# Patient Record
Sex: Male | Born: 1940 | Race: White | Hispanic: No | Marital: Married | Smoking: Never smoker
Health system: Southern US, Community
[De-identification: ages and names within clinical notes are randomized; demographics above are authoritative.]

## PROBLEM LIST (undated history)

## (undated) DIAGNOSIS — M1990 Unspecified osteoarthritis, unspecified site: Secondary | ICD-10-CM

## (undated) DIAGNOSIS — K219 Gastro-esophageal reflux disease without esophagitis: Secondary | ICD-10-CM

## (undated) DIAGNOSIS — I10 Essential (primary) hypertension: Secondary | ICD-10-CM

## (undated) DIAGNOSIS — N200 Calculus of kidney: Secondary | ICD-10-CM

## (undated) DIAGNOSIS — T7840XA Allergy, unspecified, initial encounter: Secondary | ICD-10-CM

## (undated) DIAGNOSIS — R1319 Other dysphagia: Secondary | ICD-10-CM

## (undated) HISTORY — DX: Gastro-esophageal reflux disease without esophagitis: K21.9

## (undated) HISTORY — DX: Unspecified osteoarthritis, unspecified site: M19.90

## (undated) HISTORY — DX: Calculus of kidney: N20.0

## (undated) HISTORY — DX: Essential (primary) hypertension: I10

## (undated) HISTORY — DX: Allergy, unspecified, initial encounter: T78.40XA

## (undated) HISTORY — PX: EYE SURGERY: SHX253

## (undated) HISTORY — PX: TONSILLECTOMY: SUR1361

## (undated) HISTORY — PX: HERNIA REPAIR: SHX51

## (undated) HISTORY — DX: Other dysphagia: R13.19

---

## 2005-10-30 ENCOUNTER — Emergency Department (HOSPITAL_COMMUNITY): Admission: EM | Admit: 2005-10-30 | Discharge: 2005-10-30 | Payer: Self-pay | Admitting: Emergency Medicine

## 2006-08-27 ENCOUNTER — Ambulatory Visit: Payer: Self-pay | Admitting: Internal Medicine

## 2006-08-27 LAB — CONVERTED CEMR LAB
BUN: 14 mg/dL (ref 6–23)
Basophils Absolute: 0 10*3/uL (ref 0.0–0.1)
Bilirubin Urine: NEGATIVE
CO2: 28 meq/L (ref 19–32)
Calcium: 8.9 mg/dL (ref 8.4–10.5)
Chloride: 107 meq/L (ref 96–112)
Chol/HDL Ratio, serum: 4
Cholesterol: 146 mg/dL (ref 0–200)
Creatinine, Ser: 1.1 mg/dL (ref 0.4–1.5)
HDL: 36.4 mg/dL — ABNORMAL LOW (ref >39.0)
Hemoglobin, Urine: NEGATIVE
Hemoglobin: 12.2 g/dL — ABNORMAL LOW (ref 13.0–17.0)
Ketones, ur: NEGATIVE mg/dL
LDL Cholesterol: 97 mg/dL (ref 0–99)
Lymphocytes Relative: 28.7 % (ref 12.0–46.0)
Monocytes Relative: 8.1 % (ref 3.0–11.0)
Neutrophils Relative %: 59.8 % (ref 43.0–77.0)
PSA: 1 ng/mL
Potassium: 4.6 meq/L (ref 3.5–5.1)
TSH: 1.66 microintl units/mL (ref 0.35–5.50)
Total Protein: 6.2 g/dL (ref 6.0–8.3)
Urine Glucose: NEGATIVE mg/dL

## 2006-09-06 ENCOUNTER — Ambulatory Visit: Payer: Self-pay | Admitting: Internal Medicine

## 2007-08-12 ENCOUNTER — Encounter: Payer: Self-pay | Admitting: Internal Medicine

## 2007-08-12 DIAGNOSIS — K219 Gastro-esophageal reflux disease without esophagitis: Secondary | ICD-10-CM

## 2007-08-12 DIAGNOSIS — K469 Unspecified abdominal hernia without obstruction or gangrene: Secondary | ICD-10-CM | POA: Insufficient documentation

## 2007-08-12 DIAGNOSIS — Z87442 Personal history of urinary calculi: Secondary | ICD-10-CM

## 2007-12-07 ENCOUNTER — Ambulatory Visit: Payer: Self-pay | Admitting: Internal Medicine

## 2007-12-07 DIAGNOSIS — M25519 Pain in unspecified shoulder: Secondary | ICD-10-CM | POA: Insufficient documentation

## 2008-02-20 ENCOUNTER — Ambulatory Visit: Payer: Self-pay | Admitting: Internal Medicine

## 2008-10-16 ENCOUNTER — Encounter: Payer: Self-pay | Admitting: Internal Medicine

## 2009-01-15 ENCOUNTER — Telehealth: Payer: Self-pay | Admitting: Internal Medicine

## 2009-07-09 ENCOUNTER — Ambulatory Visit: Payer: Self-pay | Admitting: Internal Medicine

## 2010-04-09 ENCOUNTER — Telehealth: Payer: Self-pay | Admitting: Internal Medicine

## 2010-12-02 NOTE — Progress Notes (Signed)
  Phone Note Call from Patient Call back at Work Phone [phone]   Summary of Call: Patient is requesting refill of levitra. Did not give call back number or pharmacy. left mess to call office back. Initial call taken by: Lamar Sprinkles, CMA,  April 09, 2010 3:22 PM  Follow-up for Phone Call        Patient is requesting refill at [facility] in [place], [phone] Follow-up by: Lamar Sprinkles, CMA,  April 09, 2010 3:44 PM  Additional Follow-up for Phone Call Additional follow up Details #1::        Pt aware that rx sent to pharmacy per scheduler.Marland KitchenMarland KitchenMarland KitchenAlvy Beal Archie CMA  April 10, 2010 10:40 AM     Prescriptions: LEVITRA 10 MG TABS (VARDENAFIL HCL) use as directed  #18 x 2   Entered by:   Lamar Sprinkles, CMA   Authorized by:   Jacques Navy MD   Signed by:   Lamar Sprinkles, CMA on 04/09/2010   Method used:   Electronically to        [facility] (retail)       [address]       [place], [place]  [address]       Ph: [phone]       Fax: [phone]   RxID:   [id number]

## 2011-03-20 NOTE — Assessment & Plan Note (Signed)
[facility]                             PRIMARY CARE OFFICE NOTE   TRIGG, DELAROCHA                        MRN:          [id number]  DATE:09/06/2006                            DOB:          11-Mar-1941    Antonio Ferrell is a very pleasant 70 year old Caucasian gentleman who presents  to establish for ongoing continuity care.   CHIEF COMPLAINT:  1. Right shoulder pain.  The patient has been told he has osteoarthritis      with wear and tear of the joint by x-ray.  He reports he has had a      steroid injection in April of 2007 with some improvement in discomfort.      He does have fairly normal range of motion except for external rotation      and extension.  He reports he has decreased the amount of weight he      uses with bench press and other exercises to avoid strain.  2. Sleep habit.  The patient reports he has occasional insomnia which is      intermittent.  3. Nocturia with two to three episodes nightly, however, this does not      disturb his sleep pattern and has been longstanding.   PAST MEDICAL HISTORY:  Surgical; right groin hernia repair at age 69 months.  Tonsillectomy in 1950.  Medical illnesses; chicken pox and mumps as a child.  History of GERD which is silent with history of dysphagia status post  esophageal dilatation.  History of nephrolithiasis with an episode in 1996  with spontaneous passage of stone, episode in 2007 with spontaneous passage  of stone.   CURRENT MEDICATIONS:  1. Aspirin 81 mg daily.  2. Prilosec 20 mg q.a.m.   HABITS:  Tobacco; none.  Alcohol; social drinker.   FAMILY HISTORY:  Positive for alcohol abuse in father.  Parents with  arthritis.  Father had first MI in his 76's, died of an MI at age 56.  Mother died at age 82, she had lung cancer possibly from passive smoke  exposure.  Family history is negative for colon cancer, prostate cancer, or  diabetes.   SOCIAL HISTORY:  The patient has a Master's  degree in education.  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  The  patient was married for 18 years and then was single for 18 years and was  married for 4-1/2 years.  He has no children.  His wife has generally been  in good health, but currently is being evaluated for musculoskeletal  disorder, possibly related to Lyme's disease.   REVIEW OF SYSTEMS:  The patient has had no fever, sweats, or chills.  He has  had a mild amount of insomnia as noted.  The patient has had an eye  examination in the last  12 months.  The patient has had extensive dental  work in the last 2 years with root canals and crowns.  No cardiovascular  complaints.  The patient did have an episode of bronchitis x2 and had been  seen by a pulmonologist for this, but has no ongoing chronic lung disease.  GASTROINTESTINAL:  The patient with GERD as noted.  GENITOURINARY:  No  complaints except for nocturia as noted.  MUSCULOSKELETAL:  As noted above  with right shoulder pain, but otherwise healthy.  DERMATOLOGIC:  The patient  sees a dermatologist on a regular basis for mild sun exposure related  lesions.  No neurologic or psychiatric problems.   HEALTH MAINTENANCE:  The patient exercises on a regular basis including both  aerobics and fast walking as well as weight work.  The patient reports he  had a colonoscopy in 2002 which was normal and was scheduled for follow-up  in 2012.  The patient reports he had Pneumovax in 2005.   PHYSICAL EXAMINATION:  VITAL SIGNS:  Temperature 97.5, blood pressure  147/91, pulse 66, weight 184.  GENERAL APPEARANCE:  This is a well-nourished, muscular, and athletic  gentleman looking younger than his stated chronologic age in no  acute  distress.  HEENT:  Normocephalic and atraumatic.  EAC's and TM's were unremarkable.  Oropharynx with native dentition in great repair.  No buccal or palatal  lesions were noted.  Posterior pharynx was clear.  Conjunctivae and sclerae  were clear.  Pupils equal, round, and reactive to light and accommodation.  Funduscopic examination with handheld instrument revealed normal disc  margins with no vascular abnormalities.  NECK:  Supple without thyromegaly.  NODES:  No adenopathy was noted in the cervical or supraclavicular regions.  CHEST:  No CVA tenderness.  LUNGS:  Clear to auscultation and percussion.  HEART:  2+ radial pulse, no JVD, no carotid bruits.  He had a quiet  precordium with regular rate and rhythm without murmurs, rubs, or gallops.  ABDOMEN:  Soft, no guarding or rebound.  No organosplenomegaly was noted.  GENITOURINARY:  Normal male phallus.  Bilaterally distended testicles  without masses.  RECTAL:  Normal sphincter tone.  The prostate was smooth, normal in size and  contour without nodules.  EXTREMITIES:  Without cyanosis, clubbing, or edema or deformity.  NEUROLOGY:  Nonfocal.   LABORATORY DATA:  Hemoglobin was 12.2 grams, white count was 5500 with a  normal differential.  Chemistries were normal with a blood sugar of 99.  Kidney function normal with a creatinine of 1.1 and a GFR of 72 mL per  minute.  Electrolytes were normal.  Liver functions were normal.  Cholesterol was 146, triglycerides 64, HDL 36.4, LDL was 97.  Thyroid  function normal with a TSH of 1.66, PSA was normal at 1.00.  Urinalysis was  negative.   ASSESSMENT:  1. Musculoskeletal.  The patient did have mild crepitus in the right      shoulder, but this was mild.  I am concerned that he may have a torn      rotator cuff limiting his range of motion with extension and rotation.      Plan; observation at this time.  If the patient has continued trouble,     we could consider repeat injection.   If the patient has trouble more      frequently than every 4 months for injection, I would recommend repeat      orthopedic evaluation.  2. Nocturia.  The patient with  a normal prostate.  He is having nocturia      x2 to 3, but at this point his symptoms are not sufficient for him to      desire to start medication.  He may want to curtail some of his evening      beverage.  3. Sleep habit.  The patient actually has only occasional intermittent      insomnia which does not require medical intervention at this time.  4. Health maintenance.  The patient does need dermatology on a routine      basis.  The patient is currently up to date with colorectal cancer      screening.  The patient had a normal prostate examination and normal      PSA at today's visit.   SUMMARY:  This is a very pleasant gentleman who seems to be medically very  stable and in good health.  I have asked him to return to see me in 1 year  or on a p.r.n. basis.   The patient had intended to have Zostavax at today's visit which I forgot.  We have contacted the patient and I recommended he check with his local  county health department for availability of Zostavax.  If he does not have  local availability in his home locale, we would be happy to see him back for  Zostavax if needed.    ______________________________  Antonio Gess Norins, MD    MEN/MedQ  DD: 09/07/2006  DT: 09/07/2006  Job #: [id number]   cc:   Lyndal Rainbow

## 2011-05-21 ENCOUNTER — Other Ambulatory Visit: Payer: Self-pay | Admitting: Internal Medicine

## 2011-08-10 ENCOUNTER — Encounter: Payer: Self-pay | Admitting: Internal Medicine

## 2011-09-01 ENCOUNTER — Encounter: Payer: Self-pay | Admitting: Internal Medicine

## 2011-09-01 ENCOUNTER — Ambulatory Visit (INDEPENDENT_AMBULATORY_CARE_PROVIDER_SITE_OTHER): Payer: Medicare Other | Admitting: Internal Medicine

## 2011-09-01 VITALS — BP 168/100 | HR 67 | Temp 97.0°F | Wt 178.0 lb

## 2011-09-01 DIAGNOSIS — Z23 Encounter for immunization: Secondary | ICD-10-CM

## 2011-09-01 DIAGNOSIS — M25519 Pain in unspecified shoulder: Secondary | ICD-10-CM

## 2011-09-01 DIAGNOSIS — I10 Essential (primary) hypertension: Secondary | ICD-10-CM

## 2011-09-01 MED ORDER — FUROSEMIDE 20 MG PO TABS: 20.0000 mg | ORAL_TABLET | Freq: Every day | ORAL | Status: DC

## 2011-09-01 NOTE — Patient Instructions (Signed)
Blood pressure - definitely in the treatment zone. Will start with a diuretic, furosemide 20 mg once a day, as a single agent. It will take 10-14 days to see the maximum benefit. Please check BP at home and report back. If the BP is not at goal, SBP 130 or less, DBP less than 90, will add another product, Losartan 50 mg an ARB which we can do without a visit. After a month of therapy you will need to have routine follow-up labv to check kidney function.  Arthritis treatment: it is ok to use any NSAID on an as needed basis. Meloxicam does have a lower risk profile in regard to bleeding and kidney insult.  Long term use of Prilosec, a PPI, and risks of bone weakening and anemia are to my knowledge not well substantiated. In the setting of painless reflux with a history of esophageal stricture you need some type of protection. You may ask Dr. Juanda Chance about the use of H2  Blockers, i.e. Zantac etc instead of PPIs.

## 2011-09-02 DIAGNOSIS — I10 Essential (primary) hypertension: Secondary | ICD-10-CM | POA: Insufficient documentation

## 2011-09-02 NOTE — Progress Notes (Signed)
  Subjective:    Patient ID: Antonio Ferrell, male    DOB: 1941/06/26, 70 y.o.   MRN: [id number]  HPI Antonio Ferrell is here today to discuss blood pressure control. Over the past year or so there has been a gradual increase in BP. He is very fit, exercising with weight training and aerobics several times a week. He has had no symptoms. He is ready to start treatment.  Antonio Ferrell has a history of joint issues: some knee pain, fairly significant right shoulder pain with some limitation in range of motion and increasing irritation of the left shoulder. He sees an orthopedist in [place] who has prescribed meloxicam 15 mg qd. He has tried this medication with relief of his symptoms but he has a concern about daily use. Reviewed the mechanism of action of NSAIDs, meloxciam as a COX II selective agent in particular  He is otherwise doing well and feels healthy.  Past Medical History  Diagnosis Date  . Other dysphagia   . Nephrolithiasis   . GERD (gastroesophageal reflux disease)    Past Surgical History  Procedure Date  . Tonsillectomy   . Hernia repair    Family History  Problem Relation Age of Onset  . Cancer Mother   . Heart disease Father   . Heart attack Father   . Alcohol abuse Father    History   Social History  . Marital Status: Married    Spouse Name: N/A    Number of Children: N/A  . Years of Education: N/A   Occupational History  . Not on file.   Social History Main Topics  . Smoking status: Not on file  . Smokeless tobacco: Not on file  . Alcohol Use:   . Drug Use:   . Sexually Active:    Other Topics Concern  . 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 Refurbished and restored a hypercub       Review of  Systems System review is negative for any constitutional, cardiac, pulmonary, GI or neuro symptoms or complaints other than as described in the HPI.     Objective:   Physical Exam Vitals noted - elevated BP Gen'l - very fit appearing white man who looks younger than his stated age HEENT C&S clear Pulm - normal respirations Cor - RRR Neuro - A&O x 3, CN II-XII normal, normal gait.       Assessment & Plan:

## 2011-09-02 NOTE — Assessment & Plan Note (Signed)
BP Readings from Last 3 Encounters:  09/01/11 168/100  07/09/09 144/90  12/07/07 160/92   Discussed treatment options.  Plan - furosemide 20 mg qd - he is to report back readings after 10-14 days           If not at goal of 130's/80's will add losartan 50 mg qd           He will need lab in 4-6 weeks: follow renal function and potassium

## 2011-09-02 NOTE — Assessment & Plan Note (Signed)
Reviewed mechanism of NSAIDs as well as various products and dosing comparisons  Plan - he will use ibuprofen 200 mg, 1-3 tabs, prn for discomfort.

## 2011-09-18 ENCOUNTER — Encounter: Payer: Self-pay | Admitting: Internal Medicine

## 2011-09-18 ENCOUNTER — Ambulatory Visit (INDEPENDENT_AMBULATORY_CARE_PROVIDER_SITE_OTHER): Payer: Medicare Other | Admitting: Internal Medicine

## 2011-09-18 VITALS — BP 120/82 | HR 62 | Ht 68.0 in | Wt 179.4 lb

## 2011-09-18 DIAGNOSIS — K219 Gastro-esophageal reflux disease without esophagitis: Secondary | ICD-10-CM

## 2011-09-18 DIAGNOSIS — K222 Esophageal obstruction: Secondary | ICD-10-CM

## 2011-09-18 DIAGNOSIS — Z1211 Encounter for screening for malignant neoplasm of colon: Secondary | ICD-10-CM

## 2011-09-18 NOTE — Progress Notes (Signed)
Antonio Ferrell [birth year] MRN [id number]    History of Present Illness:  This is a 70 year old white male who is here to discuss having a recall colonoscopy and possible upper endoscopy. He had a colonoscopy in 2003 in [place]. It was a normal exam. There is no family history of colon cancer or any symptoms of bleeding, abdominal pain or change in bowel habits. He would be due for a repeat colonoscopy in March 2013. Another issue has been gastroesophageal reflux. Approximately 5 years ago, he underwent an upper endoscopy and esophageal dilation in [place] for a benign distal esophageal stricture. The dilatation was effective and he has not complained of dysphagia since then. He has remained on Prilosec 20 mg daily. He does not follow anti-reflex measures including head of the bed elevation because of his back problems. He does not smoke, does not drink alcohol or excess caffeine. His weight has been stable. He denies any dysphagia or odynophagia. He would like to know if cutting Prilosec down to 10 mg a day would be acceptable. He is also inquiring about Nissen fundoplication. He has never had actual heartburn.   Past Medical History  Diagnosis Date  . Other dysphagia   . Nephrolithiasis   . GERD (gastroesophageal reflux disease)   . High blood pressure    Past Surgical History  Procedure Date  . Tonsillectomy   . Hernia repair     right groin    reports that he has never smoked. He has never used smokeless tobacco. He reports that he drinks alcohol. He reports that he does not use illicit drugs. family history includes Alcohol abuse in his father; Heart attack in his father; Heart disease in his father; and Lung cancer in his mother. No Known Allergies      Review of Systems:Denies abdominal pain shortness of breath chest pain  The remainder of the 10 point ROS is negative except as outlined in H&P     Assessment and Plan:  Problem #1 chronic gastroesophageal reflux. Patient  has had silent reflux. That means he has never had heartburn but actually has an esophageal stricture. In order to establish the appropriate amount of Prilosec, we would have to do either a 24 hour pH probe or a bravo probe and he is not interested in doing it. He will try reducing his Prilosec to 10 mg a day realizing that he may need esophageal dilatation in the near future if he has inadequate acid suppression. I also strongly urged him to follow antireflux measures to minimize nocturnal reflux. He would like to have an upper endoscopy with possible dilatation at the time of his colonoscopy.  Problem #2 colorectal screening. He will receive a recall letter in February 2013 for a March 2013 procedure. We will at that time also complete an upper endoscopy and dilatation.     09/18/2011 Antonio Ferrell

## 2011-09-18 NOTE — Patient Instructions (Addendum)
You will be due for a recall endoscopy/colonoscopy in 01/2012. We will send you a reminder in the mail when it gets closer to that time. CC: Dr Debby Bud

## 2011-10-19 ENCOUNTER — Ambulatory Visit (INDEPENDENT_AMBULATORY_CARE_PROVIDER_SITE_OTHER): Payer: Medicare Other | Admitting: Internal Medicine

## 2011-10-19 DIAGNOSIS — I10 Essential (primary) hypertension: Secondary | ICD-10-CM

## 2011-10-19 DIAGNOSIS — Z23 Encounter for immunization: Secondary | ICD-10-CM

## 2011-10-19 MED ORDER — LISINOPRIL 10 MG PO TABS: 10.0000 mg | ORAL_TABLET | Freq: Every day | ORAL | Status: DC

## 2011-10-19 MED ORDER — TETANUS-DIPHTH-ACELL PERTUSSIS 5-2.5-18.5 LF-MCG/0.5 IM SUSP: 0.5000 mL | Freq: Once | INTRAMUSCULAR | Status: DC

## 2011-10-19 NOTE — Progress Notes (Signed)
  Subjective:    Patient ID: Quatavious Rossa, male    DOB: 1941-06-24, 70 y.o.   MRN: [id number]  HPI Mr. Lantigua presents for follow-up of BP. He is taking furosemide as a single agent. He reports that his BP at home is in the 130-140 range over 80-90. He is tolerating medication well.   I have reviewed the patient's medical history in detail and updated the computerized patient record.    Review of Systems System review is negative for any constitutional, cardiac, pulmonary, GI or neuro symptoms or complaints other than as described in the HPI.     Objective:   Physical Exam Vitals, including Blood pressure reviewed Gen'l- WNWD white man in no distress HEENT C&S clear Cor- RRR Pulm - normal respirations Neuro - A&O x 3       Assessment & Plan:

## 2011-10-19 NOTE — Patient Instructions (Signed)
Start lisinopril for better blood pressure control. Come by the lab in 2+ weeks for kidney and potassium check - no appointment needed. Call to report your blood pressure readings. After you labs I will send you a letter with the results and the history of your BP control.

## 2011-10-20 NOTE — Assessment & Plan Note (Signed)
BP Readings from Last 3 Encounters:  10/19/11 148/100  09/18/11 120/82  09/01/11 168/100   suboptimal control on office reading. Home readings are borderline to high.  Plan - add lisinopril to regimen           Continue furosemide           Follow-up metabolic panel in 2-3 weeks.

## 2011-10-30 ENCOUNTER — Emergency Department (HOSPITAL_COMMUNITY): Payer: Medicare Other

## 2011-10-30 ENCOUNTER — Encounter (HOSPITAL_COMMUNITY): Payer: Self-pay | Admitting: Emergency Medicine

## 2011-10-30 ENCOUNTER — Emergency Department (HOSPITAL_COMMUNITY)
Admission: EM | Admit: 2011-10-30 | Discharge: 2011-10-30 | Disposition: A | Discharge status: Home / Self Care | Payer: Medicare Other | Attending: Emergency Medicine | Admitting: Emergency Medicine

## 2011-10-30 DIAGNOSIS — Z79899 Other long term (current) drug therapy: Secondary | ICD-10-CM | POA: Insufficient documentation

## 2011-10-30 DIAGNOSIS — K219 Gastro-esophageal reflux disease without esophagitis: Secondary | ICD-10-CM | POA: Insufficient documentation

## 2011-10-30 DIAGNOSIS — J3489 Other specified disorders of nose and nasal sinuses: Secondary | ICD-10-CM | POA: Insufficient documentation

## 2011-10-30 DIAGNOSIS — R509 Fever, unspecified: Secondary | ICD-10-CM | POA: Insufficient documentation

## 2011-10-30 DIAGNOSIS — J029 Acute pharyngitis, unspecified: Secondary | ICD-10-CM

## 2011-10-30 DIAGNOSIS — Z7982 Long term (current) use of aspirin: Secondary | ICD-10-CM | POA: Insufficient documentation

## 2011-10-30 DIAGNOSIS — R6889 Other general symptoms and signs: Secondary | ICD-10-CM | POA: Insufficient documentation

## 2011-10-30 DIAGNOSIS — R059 Cough, unspecified: Secondary | ICD-10-CM | POA: Insufficient documentation

## 2011-10-30 DIAGNOSIS — R05 Cough: Secondary | ICD-10-CM | POA: Insufficient documentation

## 2011-10-30 DIAGNOSIS — R634 Abnormal weight loss: Secondary | ICD-10-CM | POA: Insufficient documentation

## 2011-10-30 DIAGNOSIS — R1310 Dysphagia, unspecified: Secondary | ICD-10-CM | POA: Insufficient documentation

## 2011-10-30 DIAGNOSIS — I10 Essential (primary) hypertension: Secondary | ICD-10-CM | POA: Insufficient documentation

## 2011-10-30 LAB — DIFFERENTIAL
Basophils Absolute: 0 10*3/uL (ref 0.0–0.1)
Basophils Relative: 0 % (ref 0–1)
Eosinophils Absolute: 0.4 10*3/uL (ref 0.0–0.7)
Eosinophils Relative: 3 % (ref 0–5)

## 2011-10-30 LAB — CBC
MCH: 27.9 pg (ref 26.0–34.0)
MCV: 81.5 fL (ref 78.0–100.0)
Platelets: 252 10*3/uL (ref 150–400)
RDW: 13.2 % (ref 11.5–15.5)

## 2011-10-30 LAB — POCT I-STAT, CHEM 8
Hemoglobin: 16 g/dL (ref 13.0–17.0)
Sodium: 143 mEq/L (ref 135–145)
TCO2: 28 mmol/L (ref 0–100)

## 2011-10-30 LAB — POCT I-STAT TROPONIN I: Troponin i, poc: 0 ng/mL (ref 0.00–0.08)

## 2011-10-30 MED ORDER — IOHEXOL 300 MG/ML  SOLN
100.0000 mL | Freq: Once | INTRAMUSCULAR | Status: AC | PRN
  Administered 2011-10-30: 100 mL via INTRAVENOUS

## 2011-10-30 MED ORDER — AMOXICILLIN-POT CLAVULANATE 875-125 MG PO TABS: 1.0000 | ORAL_TABLET | Freq: Two times a day (BID) | ORAL | Status: AC

## 2011-10-30 MED ORDER — DEXAMETHASONE SODIUM PHOSPHATE 4 MG/ML IJ SOLN
4.0000 mg | Freq: Once | INTRAMUSCULAR | Status: AC
  Administered 2011-10-30: 4 mg via INTRAMUSCULAR
  Filled 2011-10-30: qty 1

## 2011-10-30 NOTE — ED Notes (Signed)
Patient stable upon discharge.  

## 2011-10-30 NOTE — ED Provider Notes (Addendum)
History     CSN: [id number]  Arrival date & time 10/30/11  0005   First MD Initiated Contact with Patient 10/30/11 0410      Chief Complaint  Patient presents with  . URI    (Consider location/radiation/quality/duration/timing/severity/associated sxs/prior treatment) Patient is a 70 y.o. male presenting with URI and cough. The history is provided by the patient and a caregiver. No language interpreter was used.  URI The primary symptoms include fever and cough. Primary symptoms do not include headaches, wheezing or myalgias. Primary symptoms comment: runny nose nasal congestion The current episode started 3 to 5 days ago. This is a new problem. The problem has been gradually worsening.  Associated with: unknown. Symptoms associated with the illness include congestion and rhinorrhea. The illness is not associated with chills, facial pain or sinus pressure. Risk factors for severe complications from URI include being elderly.  Cough This is a new problem. The current episode started more than 2 days ago. The problem occurs constantly. The problem has not changed since onset.The cough is non-productive. There has been no fever. Associated symptoms include weight loss and rhinorrhea. Pertinent negatives include no chest pain, no chills, no sweats, no headaches, no myalgias, no shortness of breath, no wheezing and no eye redness. He has tried nothing for the symptoms. The treatment provided no relief. He is not a smoker. His past medical history does not include pneumonia or emphysema.  Awoke and felt like there was something deep in the throat causing him to choke.  No f/c/r.  No change in voice.    Past Medical History  Diagnosis Date  . Other dysphagia   . Nephrolithiasis   . GERD (gastroesophageal reflux disease)   . High blood pressure     Past Surgical History  Procedure Date  . Tonsillectomy   . Hernia repair     right groin    Family History  Problem Relation Age of Onset    . Lung cancer Mother   . Heart disease Father   . Heart attack Father   . Alcohol abuse Father     History  Substance Use Topics  . Smoking status: Never Smoker   . Smokeless tobacco: Never Used  . Alcohol Use: Yes      Review of Systems  Constitutional: Positive for fever and weight loss. Negative for chills.  HENT: Positive for congestion and rhinorrhea. Negative for nosebleeds, neck stiffness, sinus pressure and ear discharge.   Eyes: Negative for discharge and redness.  Respiratory: Positive for cough. Negative for chest tightness, shortness of breath, wheezing and stridor.   Cardiovascular: Negative for chest pain.  Genitourinary: Positive for decreased urine volume. Negative for difficulty urinating.  Musculoskeletal: Negative for myalgias.  Neurological: Negative for headaches.  Hematological: Negative.   Psychiatric/Behavioral: Negative.     Allergies  Review of patient's allergies indicates no known allergies.  Home Medications   Current Outpatient Rx  Name Route Sig Dispense Refill  . ASPIRIN 81 MG PO TABS Oral Take 81 mg by mouth daily.      Marland Kitchen DIPHENHYDRAMINE HCL 25 MG PO TABS Oral Take 25 mg by mouth every 6 (six) hours as needed. Allergies      . OMEGA-3 FATTY ACIDS 1000 MG PO CAPS Oral Take 2 g by mouth daily.      . FUROSEMIDE 20 MG PO TABS Oral Take 1 tablet (20 mg total) by mouth daily. 30 tablet 11  . LISINOPRIL 10 MG PO TABS Oral Take  1 tablet (10 mg total) by mouth daily. 30 tablet 11  . MELATONIN 3 MG PO CAPS Oral Take 1 capsule by mouth at bedtime.      . MELOXICAM 15 MG PO TABS Oral Take 15 mg by mouth daily.      . ADULT MULTIVITAMIN W/MINERALS CH Oral Take 1 tablet by mouth daily.      Marland Kitchen OMEPRAZOLE 20 MG PO CPDR Oral Take 20 mg by mouth daily.     Marland Kitchen PSEUDOEPH-DOXYLAMINE-DM-APAP 60-7.03-31-999 MG/30ML PO LIQD Oral Take 30 mLs by mouth every evening. Cold symptoms      . LEVITRA 10 MG PO TABS  TAKE AS DIRECTED 18 tablet 2    BP 163/98  Pulse  62  Temp(Src) 98.7 F (37.1 C) (Oral)  Resp 18  SpO2 100%  Physical Exam  Constitutional: He is oriented to person, place, and time. He appears well-developed and well-nourished. No distress.  HENT:  Head: Normocephalic and atraumatic.  Mouth/Throat: Oropharynx is clear and moist. No oropharyngeal exudate.  Eyes: EOM are normal. Pupils are equal, round, and reactive to light.  Neck: Normal range of motion. Neck supple. No JVD present.  Cardiovascular: Normal rate and regular rhythm.   Pulmonary/Chest: Effort normal and breath sounds normal. No stridor. He has no wheezes.  Abdominal: Soft. Bowel sounds are normal. There is no tenderness. There is no rebound and no guarding.  Musculoskeletal: Normal range of motion.  Lymphadenopathy:    He has no cervical adenopathy.  Neurological: He is alert and oriented to person, place, and time.  Skin: Skin is warm and dry.  Psychiatric: Thought content normal.    ED Course  Procedures (including critical care time)  Labs Reviewed  CBC - Abnormal; Notable for the following:    WBC 13.5 (*)    All other components within normal limits  DIFFERENTIAL - Abnormal; Notable for the following:    Neutro Abs 9.2 (*)    Monocytes Absolute 1.2 (*)    All other components within normal limits  POCT I-STAT, CHEM 8 - Abnormal; Notable for the following:    BUN 26 (*)    All other components within normal limits  POCT I-STAT TROPONIN I  I-STAT TROPONIN I  I-STAT, CHEM 8   Dg Neck Soft Tissue  10/30/2011  *RADIOLOGY REPORT*  Clinical Data: Difficulty breathing and swallowing; concern for epiglottitis.  NECK SOFT TISSUES - 1+ VIEW  Comparison: None.  Findings: There is mild apparent thickening of the epiglottis. This is difficult to fully assess due to patient rotation.  The aryepiglottic folds may be mildly prominent.  The nasopharynx, oropharynx and hypopharynx are otherwise unremarkable. Prevertebral soft tissues are grossly normal in thickness.  The  proximal trachea is within normal limits.  No acute osseous abnormalities are seen.  The mastoid air cells and paranasal sinuses are well-aerated.  IMPRESSION: Mild apparent thickening of the epiglottis; given clinical concern, mild epiglottitis cannot be excluded.  Findings were discussed with Dr. Symphanie Cederberg-Rasch at 05:37 a.m. on 10/30/2011.  Original Report Authenticated By: Tonia Ghent, M.D.   Dg Chest 2 View  10/30/2011  *RADIOLOGY REPORT*  Clinical Data: Cough and shortness of breath for 3 days.  CHEST - 2 VIEW  Comparison: None.  Findings: The lungs are well-aerated and clear.  There is no evidence of focal opacification, pleural effusion or pneumothorax.  The heart is normal in size; the mediastinal contour is within normal limits.  No acute osseous abnormalities are seen.  Mild degenerative  change is noted at the right humeral head.  IMPRESSION: No acute cardiopulmonary process seen.  Original Report Authenticated By: Tonia Ghent, M.D.     No diagnosis found.    MDM   Date: 10/30/2011  Rate: 60  Rhythm: normal sinus rhythm  QRS Axis: normal  Intervals: normal  ST/T Wave abnormalities: normal  Conduction Disutrbances:none  Narrative Interpretation:   Old EKG Reviewed: none available     Call EMS immediately for change in voice shortness of breath or choking sensation.  Take all antibiotics.  Patient and wife verbalize understanding and agree to follow up   TRUE Shackleford K Roger Kettles-Rasch, MD 10/30/11 0707  Jasmine Awe, MD 10/30/11 [phone]

## 2011-10-30 NOTE — ED Notes (Signed)
Pt states that he has had a cold for two days but woke up tonight and felt like he was choking and couldn't breathe. 100% on RA.

## 2011-11-05 DIAGNOSIS — J410 Simple chronic bronchitis: Secondary | ICD-10-CM | POA: Diagnosis not present

## 2011-11-05 DIAGNOSIS — K219 Gastro-esophageal reflux disease without esophagitis: Secondary | ICD-10-CM | POA: Diagnosis not present

## 2011-11-05 DIAGNOSIS — J449 Chronic obstructive pulmonary disease, unspecified: Secondary | ICD-10-CM | POA: Diagnosis not present

## 2011-11-05 DIAGNOSIS — T887XXA Unspecified adverse effect of drug or medicament, initial encounter: Secondary | ICD-10-CM | POA: Diagnosis not present

## 2011-12-10 DIAGNOSIS — R05 Cough: Secondary | ICD-10-CM | POA: Diagnosis not present

## 2011-12-10 DIAGNOSIS — J3089 Other allergic rhinitis: Secondary | ICD-10-CM | POA: Diagnosis not present

## 2011-12-10 DIAGNOSIS — J449 Chronic obstructive pulmonary disease, unspecified: Secondary | ICD-10-CM | POA: Diagnosis not present

## 2011-12-10 DIAGNOSIS — K219 Gastro-esophageal reflux disease without esophagitis: Secondary | ICD-10-CM | POA: Diagnosis not present

## 2011-12-10 DIAGNOSIS — T887XXA Unspecified adverse effect of drug or medicament, initial encounter: Secondary | ICD-10-CM | POA: Diagnosis not present

## 2011-12-14 ENCOUNTER — Other Ambulatory Visit (INDEPENDENT_AMBULATORY_CARE_PROVIDER_SITE_OTHER): Payer: Medicare Other

## 2011-12-14 DIAGNOSIS — I10 Essential (primary) hypertension: Secondary | ICD-10-CM | POA: Diagnosis not present

## 2011-12-14 LAB — COMPREHENSIVE METABOLIC PANEL
ALT: 21 U/L (ref 0–53)
CO2: 29 mEq/L (ref 19–32)
Creatinine, Ser: 1 mg/dL (ref 0.4–1.5)
GFR: 76.88 mL/min (ref >60.00)
Total Bilirubin: 0.6 mg/dL (ref 0.3–1.2)

## 2011-12-20 ENCOUNTER — Encounter: Payer: Self-pay | Admitting: Internal Medicine

## 2012-01-04 DIAGNOSIS — L570 Actinic keratosis: Secondary | ICD-10-CM | POA: Diagnosis not present

## 2012-01-04 DIAGNOSIS — C44320 Squamous cell carcinoma of skin of unspecified parts of face: Secondary | ICD-10-CM | POA: Diagnosis not present

## 2012-01-04 DIAGNOSIS — D485 Neoplasm of uncertain behavior of skin: Secondary | ICD-10-CM | POA: Diagnosis not present

## 2012-01-18 DIAGNOSIS — L570 Actinic keratosis: Secondary | ICD-10-CM | POA: Diagnosis not present

## 2012-01-18 DIAGNOSIS — C44320 Squamous cell carcinoma of skin of unspecified parts of face: Secondary | ICD-10-CM | POA: Diagnosis not present

## 2012-02-08 ENCOUNTER — Encounter: Payer: Self-pay | Admitting: Internal Medicine

## 2012-02-29 ENCOUNTER — Ambulatory Visit (INDEPENDENT_AMBULATORY_CARE_PROVIDER_SITE_OTHER): Payer: Medicare Other | Admitting: Internal Medicine

## 2012-02-29 ENCOUNTER — Encounter: Payer: Self-pay | Admitting: Internal Medicine

## 2012-02-29 VITALS — BP 138/88 | HR 69 | Temp 97.8°F | Resp 16 | Wt 182.0 lb

## 2012-02-29 DIAGNOSIS — I10 Essential (primary) hypertension: Secondary | ICD-10-CM | POA: Diagnosis not present

## 2012-02-29 NOTE — Assessment & Plan Note (Signed)
BP Readings from Last 3 Encounters:  02/29/12 138/88  10/30/11 122/64  10/19/11 148/100   Adequately controlled blood pressure on diuretics alone.  Plan - continue present medication           Continue to monitor BP at home and call if there is a pattern of elevated readings.

## 2012-02-29 NOTE — Progress Notes (Signed)
  Subjective:    Patient ID: Antonio Ferrell, male    DOB: 03/29/1941, 71 y.o.   MRN: [id number]  HPI Antonio Ferrell was started on lisinopril in addition to furosemide for control of mild hypertension. In December he had an URI with some mild laryngeal edema. Over the next several months he had a persistent cough but no further angioedema. He stopped lisinopril and the cough resolved. He reports that when he monitors his BP at home the 120-140/70's-80's. He has been asymptomatic.  PMH, FamHx and SocHx reviewed for any changes and relevance.    Review of Systems System review is negative for any constitutional, cardiac, pulmonary, GI or neuro symptoms or complaints other than as described in the HPI.     Objective:   Physical Exam Filed Vitals:   02/29/12 0953  BP: 138/88  Pulse: 69  Temp: 97.8 F (36.6 C)  Resp: 16   Gen'l- WNWD white male Chest- good breath sounds  Cor - RRR      Assessment & Plan:

## 2012-03-10 ENCOUNTER — Ambulatory Visit (AMBULATORY_SURGERY_CENTER): Payer: Medicare Other | Admitting: *Deleted

## 2012-03-10 VITALS — Ht 67.0 in | Wt 182.0 lb

## 2012-03-10 DIAGNOSIS — K222 Esophageal obstruction: Secondary | ICD-10-CM

## 2012-03-10 DIAGNOSIS — Z1211 Encounter for screening for malignant neoplasm of colon: Secondary | ICD-10-CM

## 2012-03-10 DIAGNOSIS — K219 Gastro-esophageal reflux disease without esophagitis: Secondary | ICD-10-CM

## 2012-03-10 MED ORDER — PEG-KCL-NACL-NASULF-NA ASC-C 100 G PO SOLR: ORAL | Status: DC

## 2012-03-24 ENCOUNTER — Encounter: Payer: Self-pay | Admitting: Internal Medicine

## 2012-03-24 ENCOUNTER — Emergency Department (HOSPITAL_COMMUNITY): Payer: Medicare Other

## 2012-03-24 ENCOUNTER — Telehealth: Payer: Self-pay | Admitting: *Deleted

## 2012-03-24 ENCOUNTER — Emergency Department (HOSPITAL_COMMUNITY)
Admission: EM | Admit: 2012-03-24 | Discharge: 2012-03-24 | Disposition: A | Discharge status: Home / Self Care | Payer: Medicare Other | Attending: Emergency Medicine | Admitting: Emergency Medicine

## 2012-03-24 ENCOUNTER — Telehealth: Payer: Self-pay | Admitting: Internal Medicine

## 2012-03-24 ENCOUNTER — Encounter (HOSPITAL_COMMUNITY): Payer: Self-pay

## 2012-03-24 ENCOUNTER — Ambulatory Visit (AMBULATORY_SURGERY_CENTER): Payer: Medicare Other | Admitting: Internal Medicine

## 2012-03-24 VITALS — BP 150/91 | HR 77 | Temp 95.7°F | Resp 18 | Ht 67.0 in | Wt 180.0 lb

## 2012-03-24 DIAGNOSIS — K219 Gastro-esophageal reflux disease without esophagitis: Secondary | ICD-10-CM | POA: Diagnosis not present

## 2012-03-24 DIAGNOSIS — D72829 Elevated white blood cell count, unspecified: Secondary | ICD-10-CM

## 2012-03-24 DIAGNOSIS — Z1211 Encounter for screening for malignant neoplasm of colon: Secondary | ICD-10-CM

## 2012-03-24 DIAGNOSIS — Z79899 Other long term (current) drug therapy: Secondary | ICD-10-CM | POA: Diagnosis not present

## 2012-03-24 DIAGNOSIS — R1319 Other dysphagia: Secondary | ICD-10-CM | POA: Diagnosis not present

## 2012-03-24 DIAGNOSIS — R509 Fever, unspecified: Secondary | ICD-10-CM | POA: Insufficient documentation

## 2012-03-24 DIAGNOSIS — K222 Esophageal obstruction: Secondary | ICD-10-CM

## 2012-03-24 DIAGNOSIS — Z7982 Long term (current) use of aspirin: Secondary | ICD-10-CM | POA: Diagnosis not present

## 2012-03-24 DIAGNOSIS — R1310 Dysphagia, unspecified: Secondary | ICD-10-CM | POA: Diagnosis not present

## 2012-03-24 DIAGNOSIS — R112 Nausea with vomiting, unspecified: Secondary | ICD-10-CM | POA: Diagnosis not present

## 2012-03-24 DIAGNOSIS — R1084 Generalized abdominal pain: Secondary | ICD-10-CM | POA: Diagnosis not present

## 2012-03-24 DIAGNOSIS — R1110 Vomiting, unspecified: Secondary | ICD-10-CM | POA: Diagnosis not present

## 2012-03-24 LAB — COMPREHENSIVE METABOLIC PANEL
ALT: 19 U/L (ref 0–53)
AST: 29 U/L (ref 0–37)
CO2: 22 mEq/L (ref 19–32)
Calcium: 8.8 mg/dL (ref 8.4–10.5)
Chloride: 102 mEq/L (ref 96–112)
GFR calc non Af Amer: 64 mL/min — ABNORMAL LOW (ref >90)
Sodium: 137 mEq/L (ref 135–145)
Total Bilirubin: 0.8 mg/dL (ref 0.3–1.2)

## 2012-03-24 LAB — DIFFERENTIAL
Lymphocytes Relative: 5 % — ABNORMAL LOW (ref 12–46)
Lymphs Abs: 0.9 10*3/uL (ref 0.7–4.0)
Neutrophils Relative %: 88 % — ABNORMAL HIGH (ref 43–77)

## 2012-03-24 LAB — CBC
Platelets: 266 10*3/uL (ref 150–400)
RBC: 5.09 MIL/uL (ref 4.22–5.81)
WBC: 20.1 10*3/uL — ABNORMAL HIGH (ref 4.0–10.5)

## 2012-03-24 LAB — LACTIC ACID, PLASMA: Lactic Acid, Venous: 1.4 mmol/L (ref 0.5–2.2)

## 2012-03-24 MED ORDER — SODIUM CHLORIDE 0.9 % IV SOLN: 500.0000 mL | INTRAVENOUS | Status: DC

## 2012-03-24 NOTE — Progress Notes (Signed)
Patient did not experience any of the following events: a burn prior to discharge; a fall within the facility; wrong site/side/patient/procedure/implant event; or a hospital transfer or hospital admission upon discharge from the facility. (G8907) Patient did not have preoperative order for IV antibiotic SSI prophylaxis. (G8918)  

## 2012-03-24 NOTE — ED Notes (Signed)
Pt states "had tdap about a year ago with my doctor"  tdap not given .  md lockwood notified.

## 2012-03-24 NOTE — Op Note (Signed)
[facility] [address] [place], [place]  [address]  COLONOSCOPY PROCEDURE REPORT  PATIENT:  Antonio Ferrell, Antonio Ferrell  MR#:  [id number] BIRTHDATE:  31-Dec-1940, 70 yrs. old  GENDER:  male ENDOSCOPIST:  Hedwig Morton. Juanda Chance, MD REF. BY:  Rosalyn Gess. Norins, M.D. PROCEDURE DATE:  03/24/2012 PROCEDURE:  Colonoscopy 32440 ASA CLASS:  Class II INDICATIONS:  colorectal cancer screening, average risk last colon in 2003 in [place], reportedly normal MEDICATIONS:   MAC sedation, administered by CRNA, propofol (Diprivan) 120 mg  DESCRIPTION OF PROCEDURE:   After the risks and benefits and of the procedure were explained, informed consent was obtained. Digital rectal exam was performed and revealed no rectal masses. The LB CF-H180AL [id number] endoscope was introduced through the anus and advanced to the cecum, which was identified by both the appendix and ileocecal valve.  The quality of the prep was good, using MoviPrep.  The instrument was then slowly withdrawn as the colon was fully examined. <<PROCEDUREIMAGES>>  FINDINGS:  Moderate diverticulosis was found in the sigmoid colon (see image1, image5, and image6).  This was otherwise a normal examination of the colon (see image7, image4, image3, and image2). Retroflexed views in the rectum revealed no abnormalities.    The scope was then withdrawn from the patient and the procedure completed.  COMPLICATIONS:  None ENDOSCOPIC IMPRESSION: 1) Moderate diverticulosis in the sigmoid colon 2) Otherwise normal examination RECOMMENDATIONS: 1) High fiber diet.  REPEAT EXAM:  In 10 year(s) for.  ______________________________ Hedwig Morton. Juanda Chance, MD  CC:  n. eSIGNED:   Hedwig Morton. Maelyn Berrey at 03/24/2012 11:14 AM  Lyndal Rainbow, [phone]

## 2012-03-24 NOTE — Discharge Instructions (Signed)
As discussed, it is very important that you speak with Dr. Oscar La tomorrow morning.  If you develop any new, or concerning changes in your condition, please return to the emergency department immediately.

## 2012-03-24 NOTE — Op Note (Signed)
[facility] [address] [place], [place]  [address]  ENDOSCOPY PROCEDURE REPORT  PATIENT:  Antonio Ferrell, Antonio Ferrell  MR#:  [id number] BIRTHDATE:  1941/03/18, 70 yrs. old  GENDER:  male  ENDOSCOPIST:  Hedwig Morton. Juanda Chance, MD Referred by:  Rosalyn Gess. Norins, M.D.  PROCEDURE DATE:  03/24/2012 PROCEDURE:  EGD, diagnostic 43235, Maloney Dilation of Esophagus ASA CLASS:  Class II INDICATIONS:  dysphagia hx of es. stricture, was dilated about 5 years ago  MEDICATIONS:   MAC sedation, administered by CRNA, propofol (Diprivan) 150 mg TOPICAL ANESTHETIC:  none  DESCRIPTION OF PROCEDURE:   After the risks benefits and alternatives of the procedure were thoroughly explained, informed consent was obtained.  The LB GIF-H180 [id number] endoscope was introduced through the mouth and advanced to the second portion of the duodenum, without limitations.  The instrument was slowly withdrawn as the mucosa was fully examined. <<PROCEDUREIMAGES>>  A stricture was found in the distal esophagus (see image1 and image8). mild non obstructing stricture, admitted the scope maloney dilator 23F dilator passed without difficulty  A hiatal hernia was found (see image7 and image5). 2 cm hh  Otherwise the examination was normal (see image6, image4, and image3). Retroflexed views revealed no abnormalities.    The scope was then withdrawn from the patient and the procedure completed.  COMPLICATIONS:  None  ENDOSCOPIC IMPRESSION: 1) Stricture in the distal esophagus 2) Hiatal hernia 3) Otherwise normal examination s/p passage of 48 F maloney dilator RECOMMENDATIONS: 1) Anti-reflux regimen to be follow Prilosec 10 mg prn  REPEAT EXAM:  In 0 year(s) for.  ______________________________ Hedwig Morton. Juanda Chance, MD  CC:  n. eSIGNED:   Hedwig Morton. Monee Dembeck at 03/24/2012 11:11 AM  Lyndal Rainbow, [phone]

## 2012-03-24 NOTE — Telephone Encounter (Signed)
Called pt. Back to remind him not to eat or drink anything since may do xrays in ER.

## 2012-03-24 NOTE — ED Notes (Signed)
Dc instructions provided.  Per md, pt encouraged to f/u with dr Arlyce Dice in morning.  No acute distress noted.  Pt wife at side.

## 2012-03-24 NOTE — ED Notes (Signed)
Patient had a colonoscopy today and about 2 hours later began having chills and had gradual temp elevation, the latest being 102.8. Patient vomited x 1.

## 2012-03-24 NOTE — Telephone Encounter (Signed)
Left message for patient to call me.  The message from the wife stated that the patient spiked a fever from appointment this am, and that he was vomiting.  Wife spoke with recovery room nurse, and pt is coming to [facility] via Elmon Kirschner, RN and Dr. Sharia Reeve.

## 2012-03-24 NOTE — Telephone Encounter (Signed)
Pt's wife called. Call was transferred into recovery room @ approx 4:15 pm . Pt's wife says pt . Has fever of 101.2 & pt is having chills. Has only drank a milk shake since procedure today and vomited that up. Pt. Denies any chest pain or shortness of breath, abdomen soft & pt says he is passing gas ok & has passed urine also. Spoke with Dr. Leone Payor & Cathlyn Parsons CRNA about pt's complaints. Called pt's wife back and told her pt needs to be brought to [facility] long emergency room as Dr. Leone Payor ordered. Dr. Gery Pray call) was paged & told pt . coming to [facility] ER by Elmon Kirschner RN.

## 2012-03-24 NOTE — ED Notes (Signed)
Pt had routine endoscopy and colonoscopy today. Began with fever and vomiting about 1400.

## 2012-03-24 NOTE — Patient Instructions (Signed)

## 2012-03-25 ENCOUNTER — Telehealth: Payer: Self-pay | Admitting: *Deleted

## 2012-03-25 DIAGNOSIS — R509 Fever, unspecified: Secondary | ICD-10-CM

## 2012-03-25 MED ORDER — CIPROFLOXACIN HCL 500 MG PO TABS: ORAL_TABLET | ORAL | Status: DC

## 2012-03-25 NOTE — Telephone Encounter (Signed)
Per Dr. Juanda Chance, patient needs Cipro 500 mg BID x 5 days and CBC on 03/29/12. Spoke with patient and gave him Dr. Regino Schultze recommendations. He will come for labs on Wednesday since he has another MD appointment that day and he lives in [place].

## 2012-03-25 NOTE — ED Provider Notes (Signed)
History     CSN: [id number]  Arrival date & time 03/24/12  1736   First MD Initiated Contact with Patient 03/24/12 1918      Chief Complaint  Patient presents with  . Fever  . Emesis    (Consider location/radiation/quality/duration/timing/severity/associated sxs/prior treatment) HPI The patient presents with fever, chills.  Notably, the patient had both colonoscopy and endoscopy today, approximately 7 hours prior to developing chills and fever.  Soon after he developed his chills he also became nauseous, had one episode of vomiting.  He notes that his symptoms began to resolve spontaneously and by the time of ED presentation are nearly absent.  He notes that these studies were being done as routine screening measures.  He states that he has been in his usual state of health without any ongoing illness.  No new medication changes. Past Medical History  Diagnosis Date  . Other dysphagia   . Nephrolithiasis   . GERD (gastroesophageal reflux disease)   . High blood pressure     Past Surgical History  Procedure Date  . Tonsillectomy   . Hernia repair     right groin    Family History  Problem Relation Age of Onset  . Lung cancer Mother   . Heart disease Father   . Heart attack Father   . Alcohol abuse Father   . Colon cancer Paternal Grandfather   . Esophageal cancer Neg Hx   . Stomach cancer Neg Hx   . Rectal cancer Neg Hx     History  Substance Use Topics  . Smoking status: Never Smoker   . Smokeless tobacco: Never Used  . Alcohol Use: No      Review of Systems  All other systems reviewed and are negative.    Allergies  Review of patient's allergies indicates no known allergies.  Home Medications   Current Outpatient Rx  Name Route Sig Dispense Refill  . AMOXICILLIN 500 MG PO CAPS Oral Take 500 mg by mouth 3 (three) times daily.    . ASPIRIN 81 MG PO TABS Oral Take 81 mg by mouth daily.      . OMEGA-3 FATTY ACIDS 1000 MG PO CAPS Oral Take 1 g by mouth  daily.     . FUROSEMIDE 20 MG PO TABS Oral Take 1 tablet (20 mg total) by mouth daily. 30 tablet 11  . IBUPROFEN 200 MG PO TABS Oral Take 400 mg by mouth every 6 (six) hours as needed.    Marland Kitchen LEVITRA 10 MG PO TABS  TAKE AS DIRECTED 18 tablet 2  . MELOXICAM 15 MG PO TABS Oral Take 15 mg by mouth as needed.     . ADULT MULTIVITAMIN W/MINERALS CH Oral Take 1 tablet by mouth daily.      Marland Kitchen OMEPRAZOLE 20 MG PO CPDR Oral Take 20 mg by mouth daily.     Marland Kitchen MELATONIN 3 MG PO CAPS Oral Take 1 capsule by mouth at bedtime.      Marland Kitchen PROBIOTIC PO Oral Take by mouth daily.      BP 113/64  Pulse 98  Temp(Src) 99.4 F (37.4 C) (Oral)  Resp 14  SpO2 96%  Physical Exam  Nursing note and vitals reviewed. Constitutional: He is oriented to person, place, and time. He appears well-developed. No distress.  HENT:  Head: Normocephalic and atraumatic.  Eyes: Conjunctivae and EOM are normal.  Cardiovascular: Normal rate and regular rhythm.   Pulmonary/Chest: Effort normal. No stridor. No respiratory distress.  Abdominal:  Soft. Normal appearance. He exhibits no distension. There is no hepatosplenomegaly. There is generalized tenderness. There is no rigidity, no rebound and no guarding.  Musculoskeletal: He exhibits no edema.  Neurological: He is alert and oriented to person, place, and time.  Skin: Skin is warm and dry.  Psychiatric: He has a normal mood and affect.    ED Course  Procedures (including critical care time)  Labs Reviewed  CBC - Abnormal; Notable for the following:    WBC 20.1 (*)    All other components within normal limits  DIFFERENTIAL - Abnormal; Notable for the following:    Neutrophils Relative 88 (*)    Neutro Abs 17.7 (*)    Lymphocytes Relative 5 (*)    Monocytes Absolute 1.5 (*)    All other components within normal limits  COMPREHENSIVE METABOLIC PANEL - Abnormal; Notable for the following:    GFR calc non Af Amer 64 (*)    GFR calc Af Amer 74 (*)    All other components within  normal limits  LACTIC ACID, PLASMA   Dg Abd Portable 1v  03/24/2012  *RADIOLOGY REPORT*  Clinical Data: Fever and emesis.  Rule out pneumoperitoneum.  PORTABLE ABDOMEN - 1 VIEW  Comparison: No priors.  Findings: Supine views of the abdomen demonstrate gas and stool scattered throughout the colon extending to the level of the distal rectum.  There are a few nondilated loops of gas-filled small bowel projecting over the left upper quadrant of the abdomen.  No pathologic distension of small bowel is noted.  No gross evidence of pneumoperitoneum is noted on these limited supine views of the abdomen.  IMPRESSION: 1.  Nonspecific, nonobstructive bowel gas pattern. 2.  No gross pneumoperitoneum.  Assessment is severely limited on supine views of the abdomen.  This could be better evaluated with an upright abdominal film or left lateral decubitus view of the abdomen if clinically indicated.  Original Report Authenticated By: Florencia Reasons, M.D.     1. Fever   2. Leukocytosis       MDM  This generally well male presents several hours after routine colonoscopy and endoscopy with new fevers, chills, nausea and vomiting.  On my exam the patient is in no distress, notes that his RE feeling better.  The patient has mild generalized discomfort about a non-peritoneal abdomen.  The patient's x-ray does not demonstrate pneumoperitoneum, though his labs are notable for a leukocytosis of 20,000.  During the patient's ED stay he continued to feel significantly better, was upright, walking around, asking for food.  I discussed the patient's presentation with his gastroenterologist, Dr. Arlyce Dice, and we agreed that given the patient's appetite, and his absence of distress or fever, his desire to go home, this was reasonable given his) oximetry to follow up care and the provision of return precautions.  Leukocytosis may be secondary to bacteremia, though it is unclear what the source may have been.     Gerhard Munch, MD 03/25/12 [phone]

## 2012-03-25 NOTE — Telephone Encounter (Signed)
  Follow up Call-  Call back number 03/24/2012  Post procedure Call Back phone  # [phone]  Permission to leave phone message Yes     Patient questions:  Do you have a fever, pain , or abdominal swelling? yes  Patient had fever 103 last night, patient went to [facility] ER seen by Dr.Kaplan, pt. States x-ray,blood work done. No perforation.  Pt feeling better today. Pain Score  0 *  Have you tolerated food without any problems? yes  Have you been able to return to your normal activities? yes  Do you have any questions about your discharge instructions: Diet   no Medications  no Follow up visit  no  Do you have questions or concerns about your Care? no  Actions: * If pain score is 4 or above: No action needed, pain <4.

## 2012-03-30 ENCOUNTER — Other Ambulatory Visit (INDEPENDENT_AMBULATORY_CARE_PROVIDER_SITE_OTHER): Payer: Medicare Other

## 2012-03-30 ENCOUNTER — Other Ambulatory Visit: Payer: Self-pay | Admitting: Internal Medicine

## 2012-03-30 DIAGNOSIS — R509 Fever, unspecified: Secondary | ICD-10-CM | POA: Diagnosis not present

## 2012-03-30 LAB — CBC WITH DIFFERENTIAL/PLATELET
Basophils Absolute: 0 10*3/uL (ref 0.0–0.1)
Eosinophils Absolute: 0.1 10*3/uL (ref 0.0–0.7)
Hemoglobin: 13.6 g/dL (ref 13.0–17.0)
Lymphocytes Relative: 28.7 % (ref 12.0–46.0)
MCHC: 32.7 g/dL (ref 30.0–36.0)
MCV: 84.6 fl (ref 78.0–100.0)
Monocytes Absolute: 0.7 10*3/uL (ref 0.1–1.0)
Neutro Abs: 5.1 10*3/uL (ref 1.4–7.7)
RDW: 13.2 % (ref 11.5–14.6)

## 2012-03-30 MED ORDER — OMEPRAZOLE 20 MG PO CPDR: 20.0000 mg | DELAYED_RELEASE_CAPSULE | Freq: Every day | ORAL | Status: DC | PRN

## 2012-03-30 NOTE — Telephone Encounter (Signed)
rx sent

## 2012-06-08 DIAGNOSIS — H4910 Fourth [trochlear] nerve palsy, unspecified eye: Secondary | ICD-10-CM | POA: Diagnosis not present

## 2012-06-08 DIAGNOSIS — H2510 Age-related nuclear cataract, unspecified eye: Secondary | ICD-10-CM | POA: Diagnosis not present

## 2012-06-29 DIAGNOSIS — H4910 Fourth [trochlear] nerve palsy, unspecified eye: Secondary | ICD-10-CM | POA: Diagnosis not present

## 2012-07-28 ENCOUNTER — Other Ambulatory Visit: Payer: Self-pay | Admitting: Internal Medicine

## 2012-08-02 ENCOUNTER — Other Ambulatory Visit: Payer: Self-pay | Admitting: Internal Medicine

## 2012-08-16 DIAGNOSIS — H4910 Fourth [trochlear] nerve palsy, unspecified eye: Secondary | ICD-10-CM | POA: Diagnosis not present

## 2012-08-16 DIAGNOSIS — IMO0002 Reserved for concepts with insufficient information to code with codable children: Secondary | ICD-10-CM | POA: Diagnosis not present

## 2012-08-16 DIAGNOSIS — H532 Diplopia: Secondary | ICD-10-CM | POA: Diagnosis not present

## 2012-08-27 ENCOUNTER — Other Ambulatory Visit: Payer: Self-pay | Admitting: Internal Medicine

## 2012-08-29 DIAGNOSIS — Z79899 Other long term (current) drug therapy: Secondary | ICD-10-CM | POA: Diagnosis not present

## 2012-08-29 DIAGNOSIS — I10 Essential (primary) hypertension: Secondary | ICD-10-CM | POA: Diagnosis not present

## 2012-08-29 DIAGNOSIS — H4910 Fourth [trochlear] nerve palsy, unspecified eye: Secondary | ICD-10-CM | POA: Diagnosis not present

## 2012-10-06 DIAGNOSIS — Z23 Encounter for immunization: Secondary | ICD-10-CM | POA: Diagnosis not present

## 2012-12-10 IMAGING — CR DG CHEST 2V
2 series · 2 of 2 positions shown · non-contrast
Comparison: None.

CLINICAL DATA: Cough and shortness of breath for 3 days.

CHEST - 2 VIEW

[w chest pa]
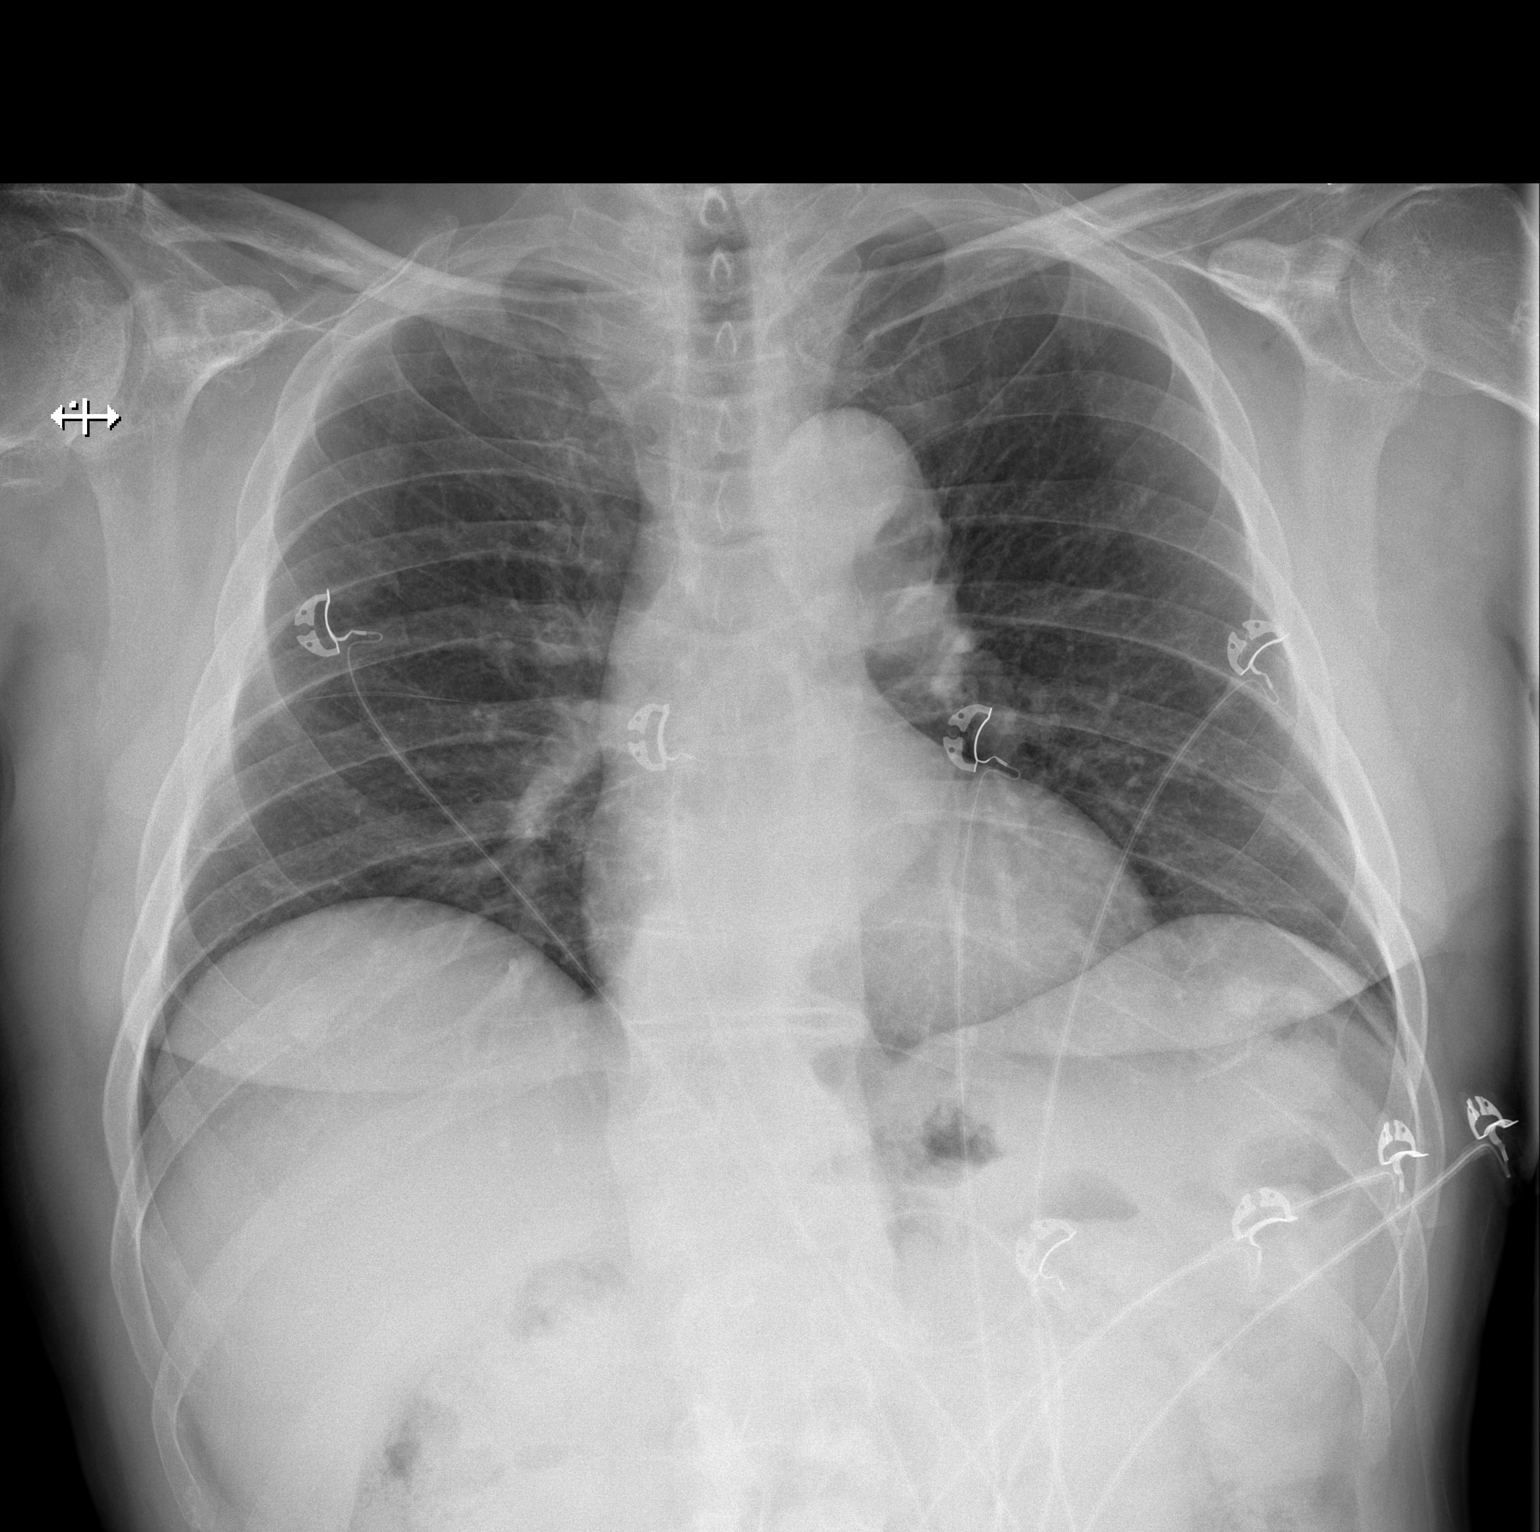

[w chest lat]
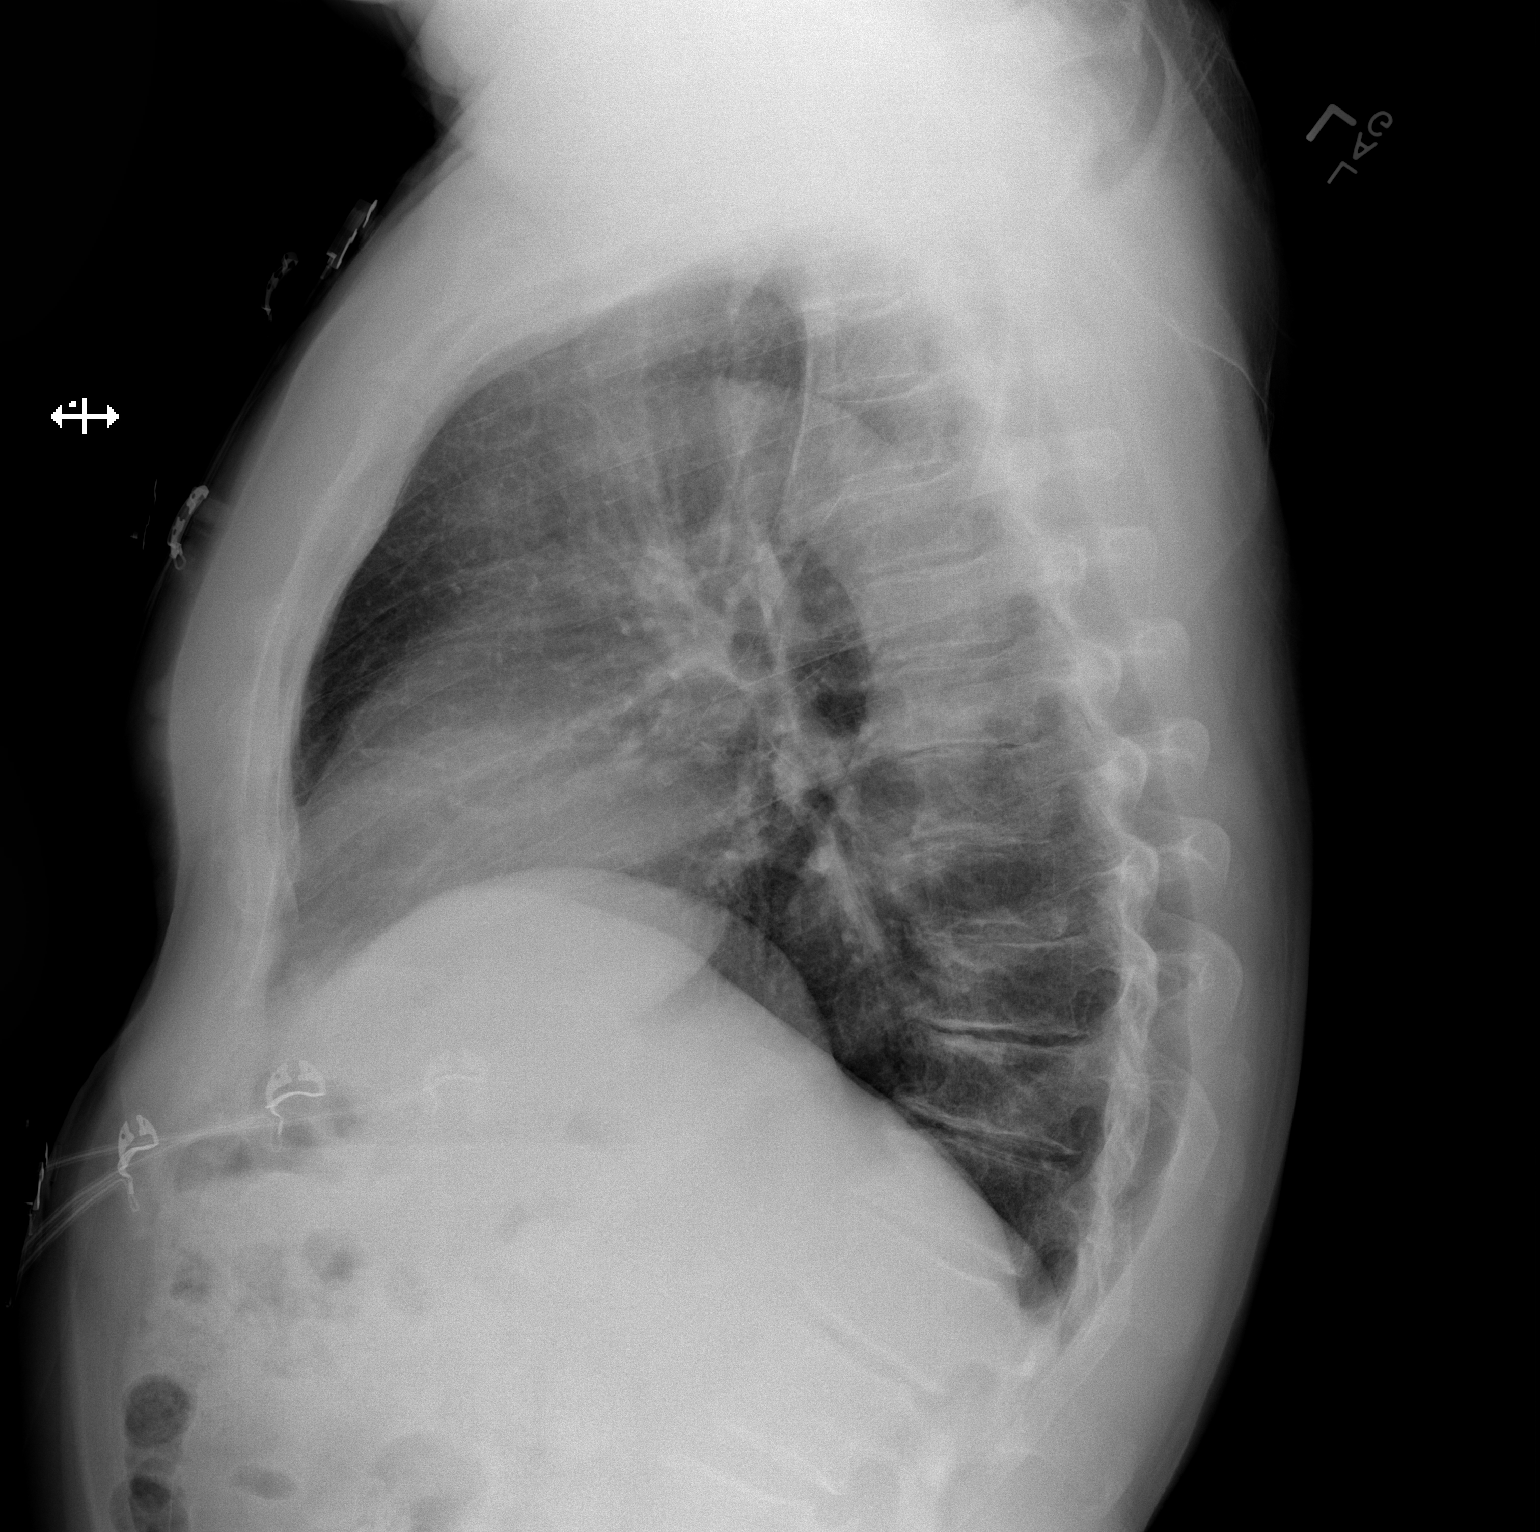

[2 of 2 positions shown; findings below may reference images not displayed]

FINDINGS: The lungs are well-aerated and clear.  There is no
evidence of focal opacification, pleural effusion or pneumothorax.

The heart is normal in size; the mediastinal contour is within
normal limits.  No acute osseous abnormalities are seen.  Mild
degenerative change is noted at the right humeral head.
IMPRESSION: No acute cardiopulmonary process seen.

## 2012-12-10 IMAGING — CT CT NECK W/ CM
2 of 5 series · 10 of 20 positions shown, 12 images · IV contrast (100 ML OMNI 300)
Comparison: Radiograph of the soft tissues of the neck performed
earlier today at [DATE] a.m.

CLINICAL DATA: Question of thickening of the epiglottis on
radiograph of the soft tissues of the neck; concern for
epiglottitis, with choking sensation and difficulty breathing.

CT NECK WITH CONTRAST
TECHNIQUE: Multidetector CT imaging of the neck was performed with
intravenous contrast.
Contrast: 100mL OMNIPAQUE IOHEXOL 300 MG/ML IV SOLN

[Series 3: neck 1.0 b20s · axial · 0.36mm/px · z∈[-302,-124]mm · 7 of 339 slices shown, 9 images]
[im 43/339  soft-tissue]
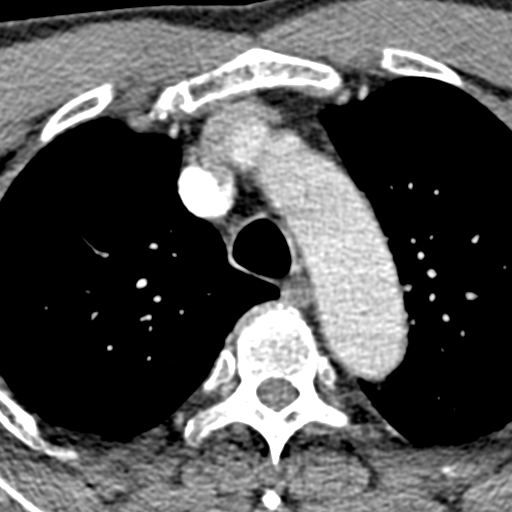
[im 43/339  bone]
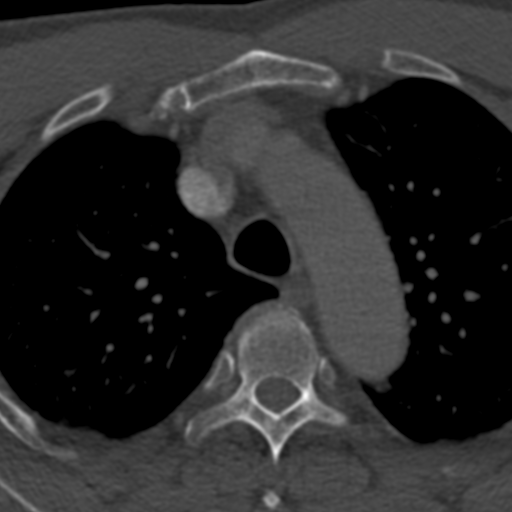
[im 85/339  bone]
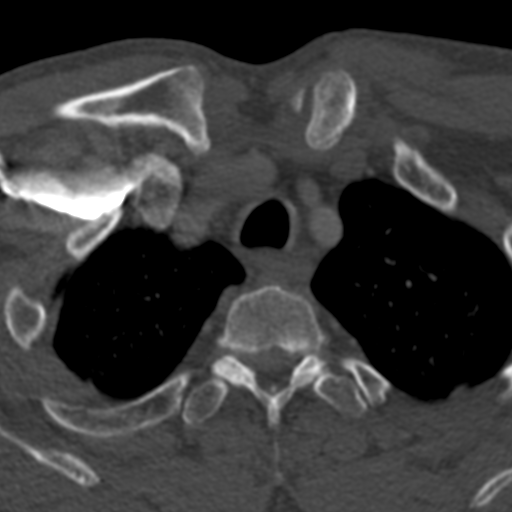
[im 127/339  bone]
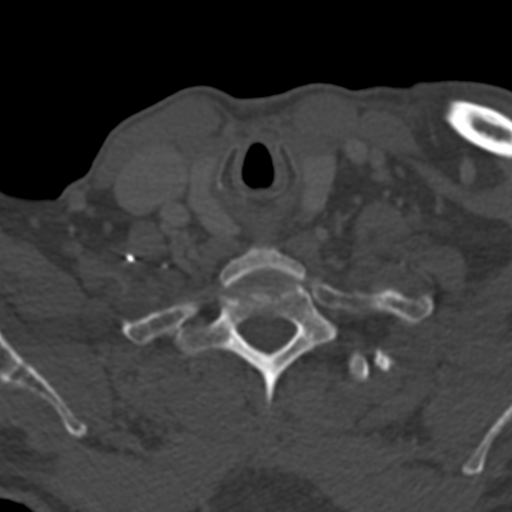
[im 170/339  bone]
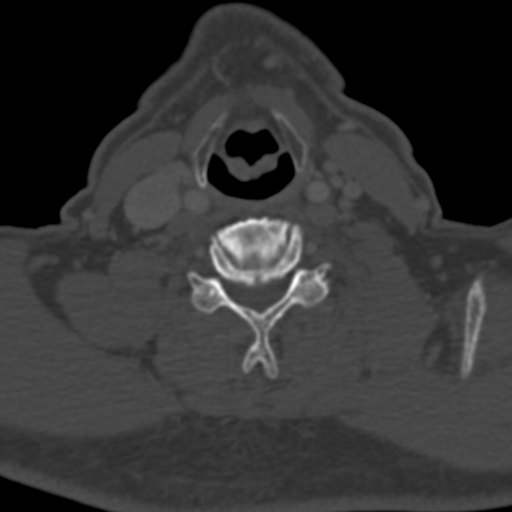
[im 212/339  soft-tissue]
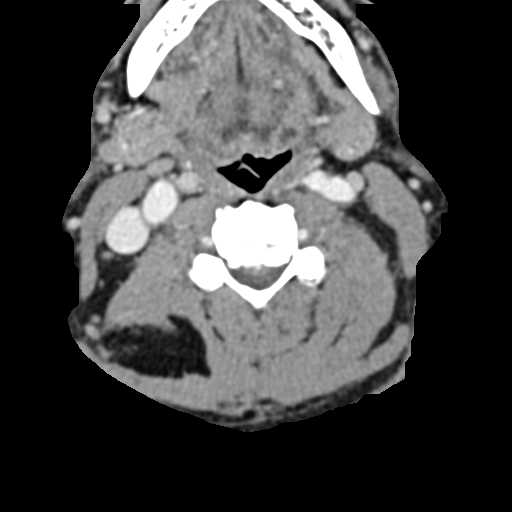
[im 212/339  bone]
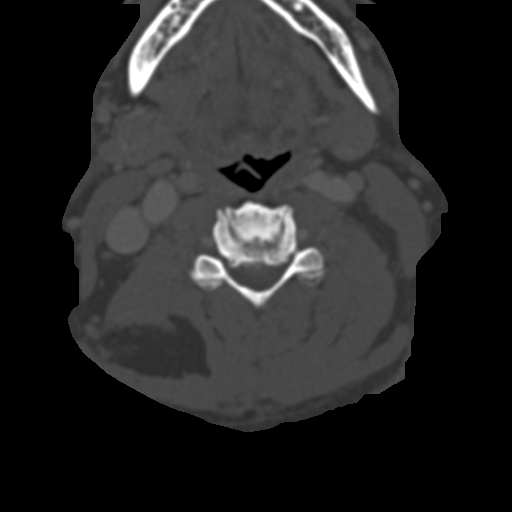
[im 254/339  bone]
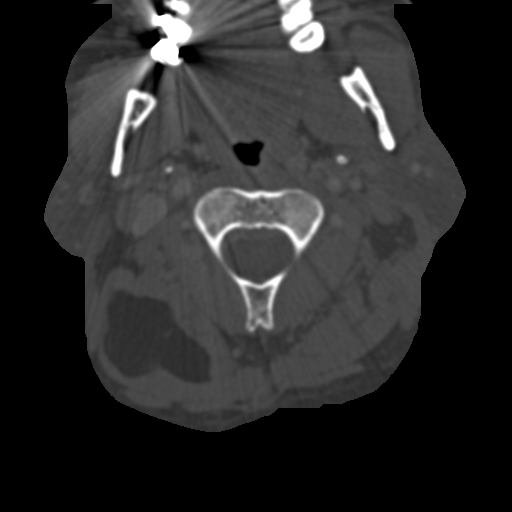
[im 296/339  bone]
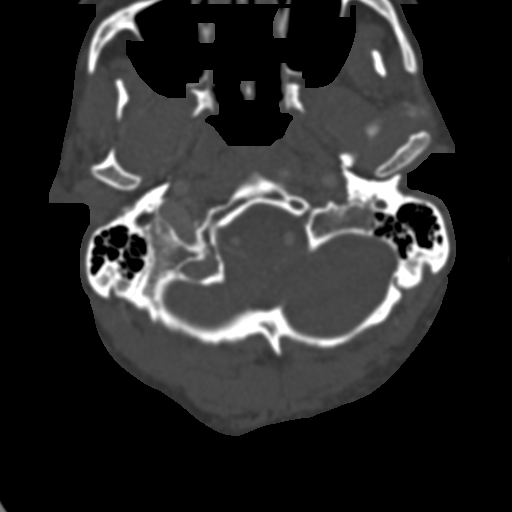

[Series 602: <mpr thick range> · coronal · 0.46mm/px · 3 of 77 slices shown]
[im 16/77  bone]
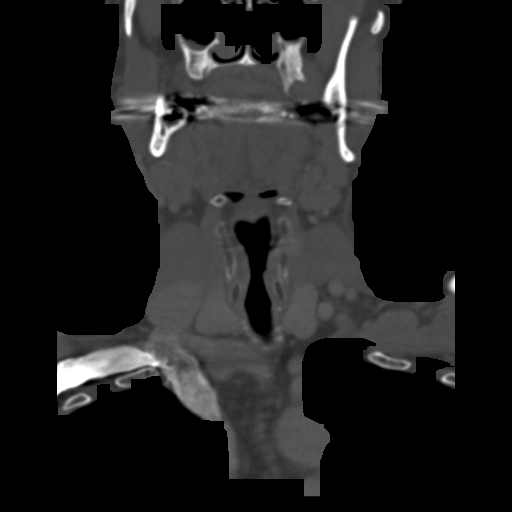
[im 31/77  bone]
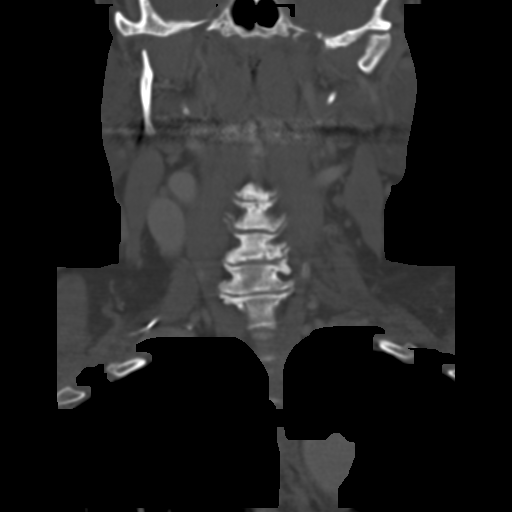
[im 61/77  bone]
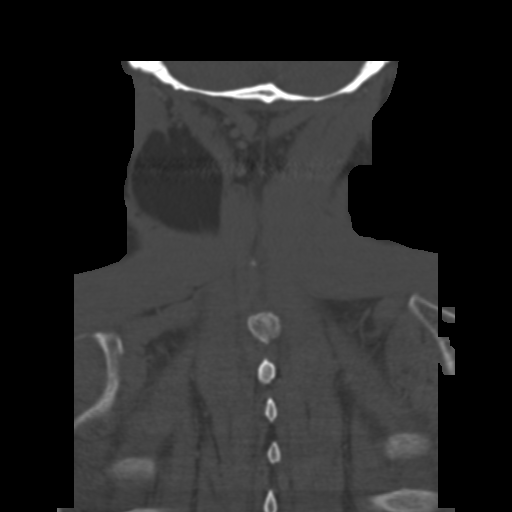

[10 of 20 positions shown; findings below may reference images not displayed]

FINDINGS: The epiglottis is normal in thickness; there is no
evidence for epiglottitis.

The nasopharynx, oropharynx and hypopharynx are unremarkable in
appearance.  Apparent prominence of the tongue base is likely
within normal limits.  The ethmoid and palatine tonsils are grossly
unremarkable in appearance.  The valleculae and piriform sinuses
are preserved.  The vocal cords are within normal limits.  The
proximal trachea is normal in caliber.  The thyroid gland is
unremarkable in appearance.

The parotid and submandibular glands are relatively symmetric.
Scattered small cervical nodes are noted bilaterally, without
evidence of cervical lymphadenopathy.  The parapharyngeal fat
planes are preserved.  There is no evidence of vascular compromise.

The visualized portions of the posterior orbits are grossly
unremarkable.  The visualized paranasal sinuses and mastoid air
cells are well-aerated.  The visualized portions of the brain are
grossly normal in appearance.

The superior mediastinum is unremarkable in appearance.  The
visualized lung apices are clear.  Note is made of a prominent
x 4.7 x 3.4 cm lipoma within the musculature along the posterior
right side of the neck.  Its imaging characteristics are compatible
with a benign lipoma.

No acute osseous abnormalities are identified.  Note is made of
multilevel disc space narrowing along the cervical spine, with
associated anterior and posterior disc complexes.  Mild
degenerative change is noted about the dens.
IMPRESSION: 1.  No evidence for epiglottitis.
2.  Degenerative change noted along the cervical spine.
3.  Prominent 6.0 cm benign-appearing intramuscular lipoma noted
along the posterior right side of the neck.

## 2013-02-16 DIAGNOSIS — D2360 Other benign neoplasm of skin of unspecified upper limb, including shoulder: Secondary | ICD-10-CM | POA: Diagnosis not present

## 2013-02-16 DIAGNOSIS — C44319 Basal cell carcinoma of skin of other parts of face: Secondary | ICD-10-CM | POA: Diagnosis not present

## 2013-02-16 DIAGNOSIS — D235 Other benign neoplasm of skin of trunk: Secondary | ICD-10-CM | POA: Diagnosis not present

## 2013-02-16 DIAGNOSIS — L821 Other seborrheic keratosis: Secondary | ICD-10-CM | POA: Diagnosis not present

## 2013-02-16 DIAGNOSIS — D485 Neoplasm of uncertain behavior of skin: Secondary | ICD-10-CM | POA: Diagnosis not present

## 2013-02-16 DIAGNOSIS — D234 Other benign neoplasm of skin of scalp and neck: Secondary | ICD-10-CM | POA: Diagnosis not present

## 2013-02-16 DIAGNOSIS — Z85828 Personal history of other malignant neoplasm of skin: Secondary | ICD-10-CM | POA: Diagnosis not present

## 2013-03-02 DIAGNOSIS — C44319 Basal cell carcinoma of skin of other parts of face: Secondary | ICD-10-CM | POA: Diagnosis not present

## 2013-03-13 ENCOUNTER — Other Ambulatory Visit: Payer: Self-pay | Admitting: Internal Medicine

## 2013-05-05 IMAGING — CR DG ABD PORTABLE 1V
1 series · 2 of 2 positions shown · non-contrast
Comparison: No priors.

CLINICAL DATA: Fever and emesis.  Rule out pneumoperitoneum.

PORTABLE ABDOMEN - 1 VIEW

[Series 1: ap (kub) · U · 2 of 2 slices shown]
[im 1/2]
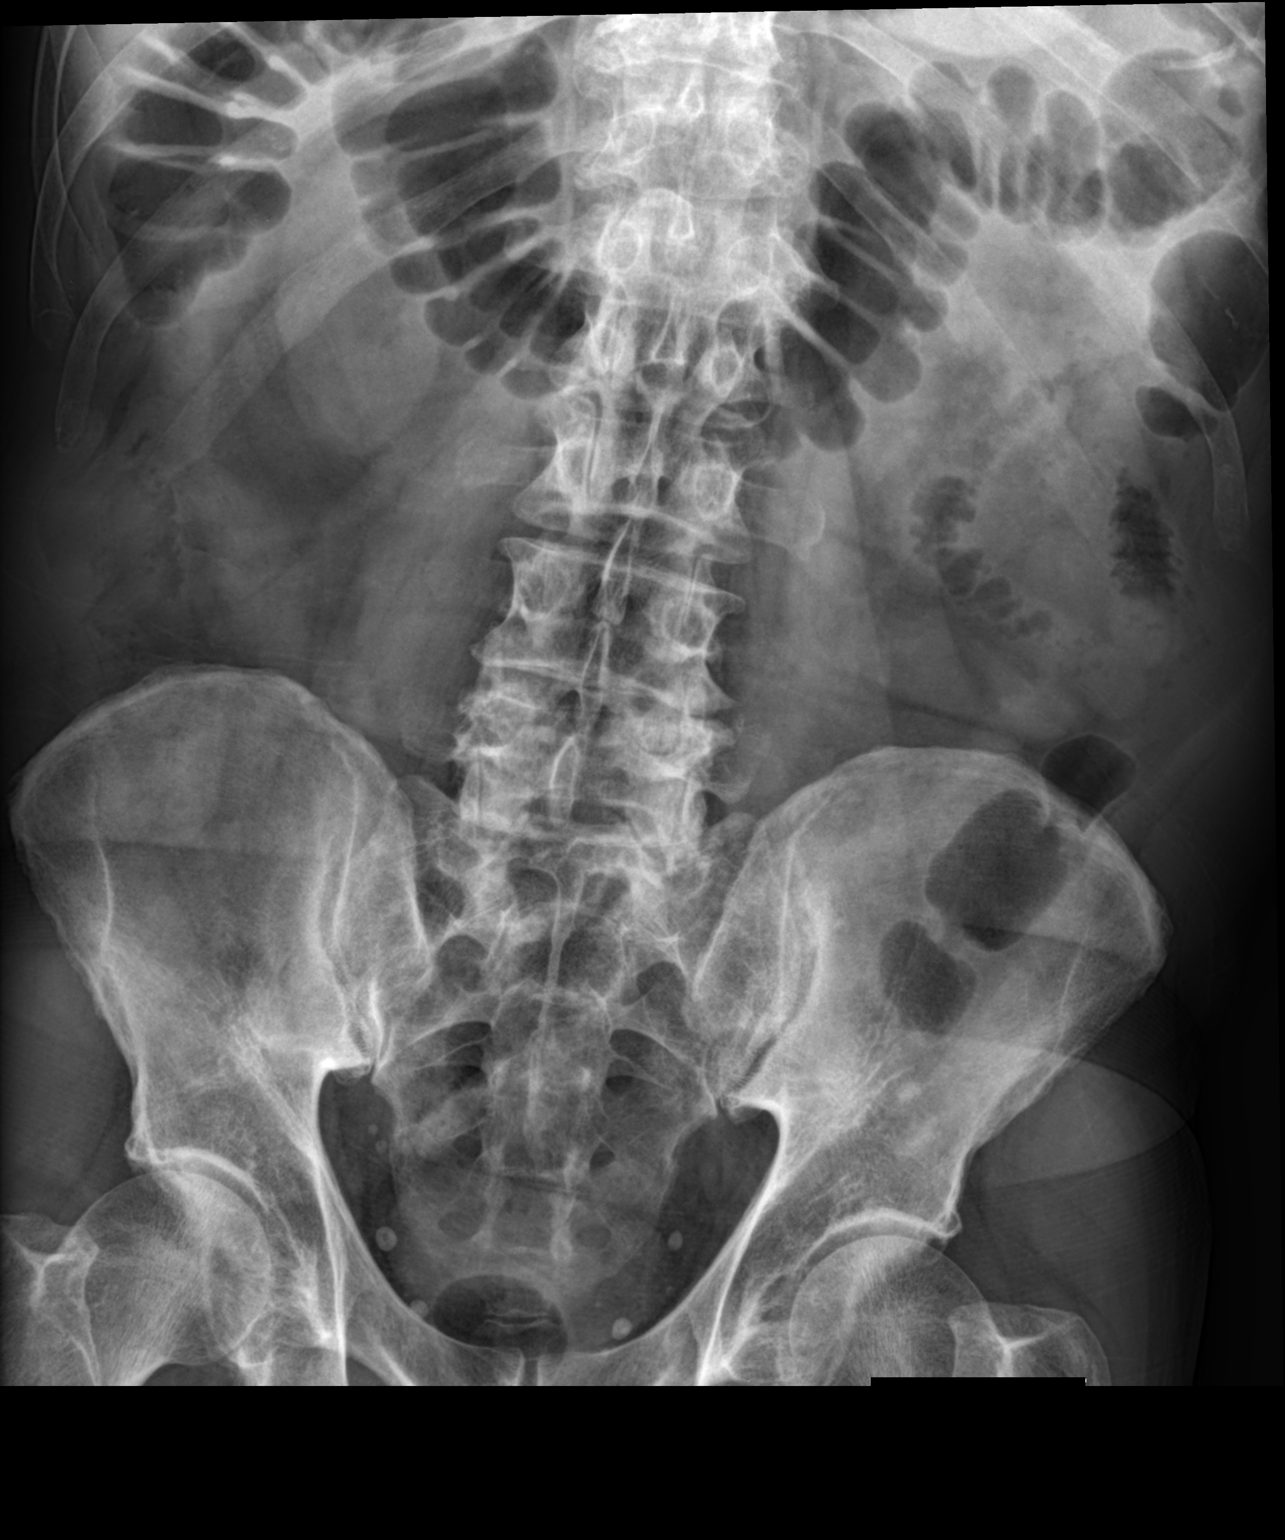
[im 2/2]
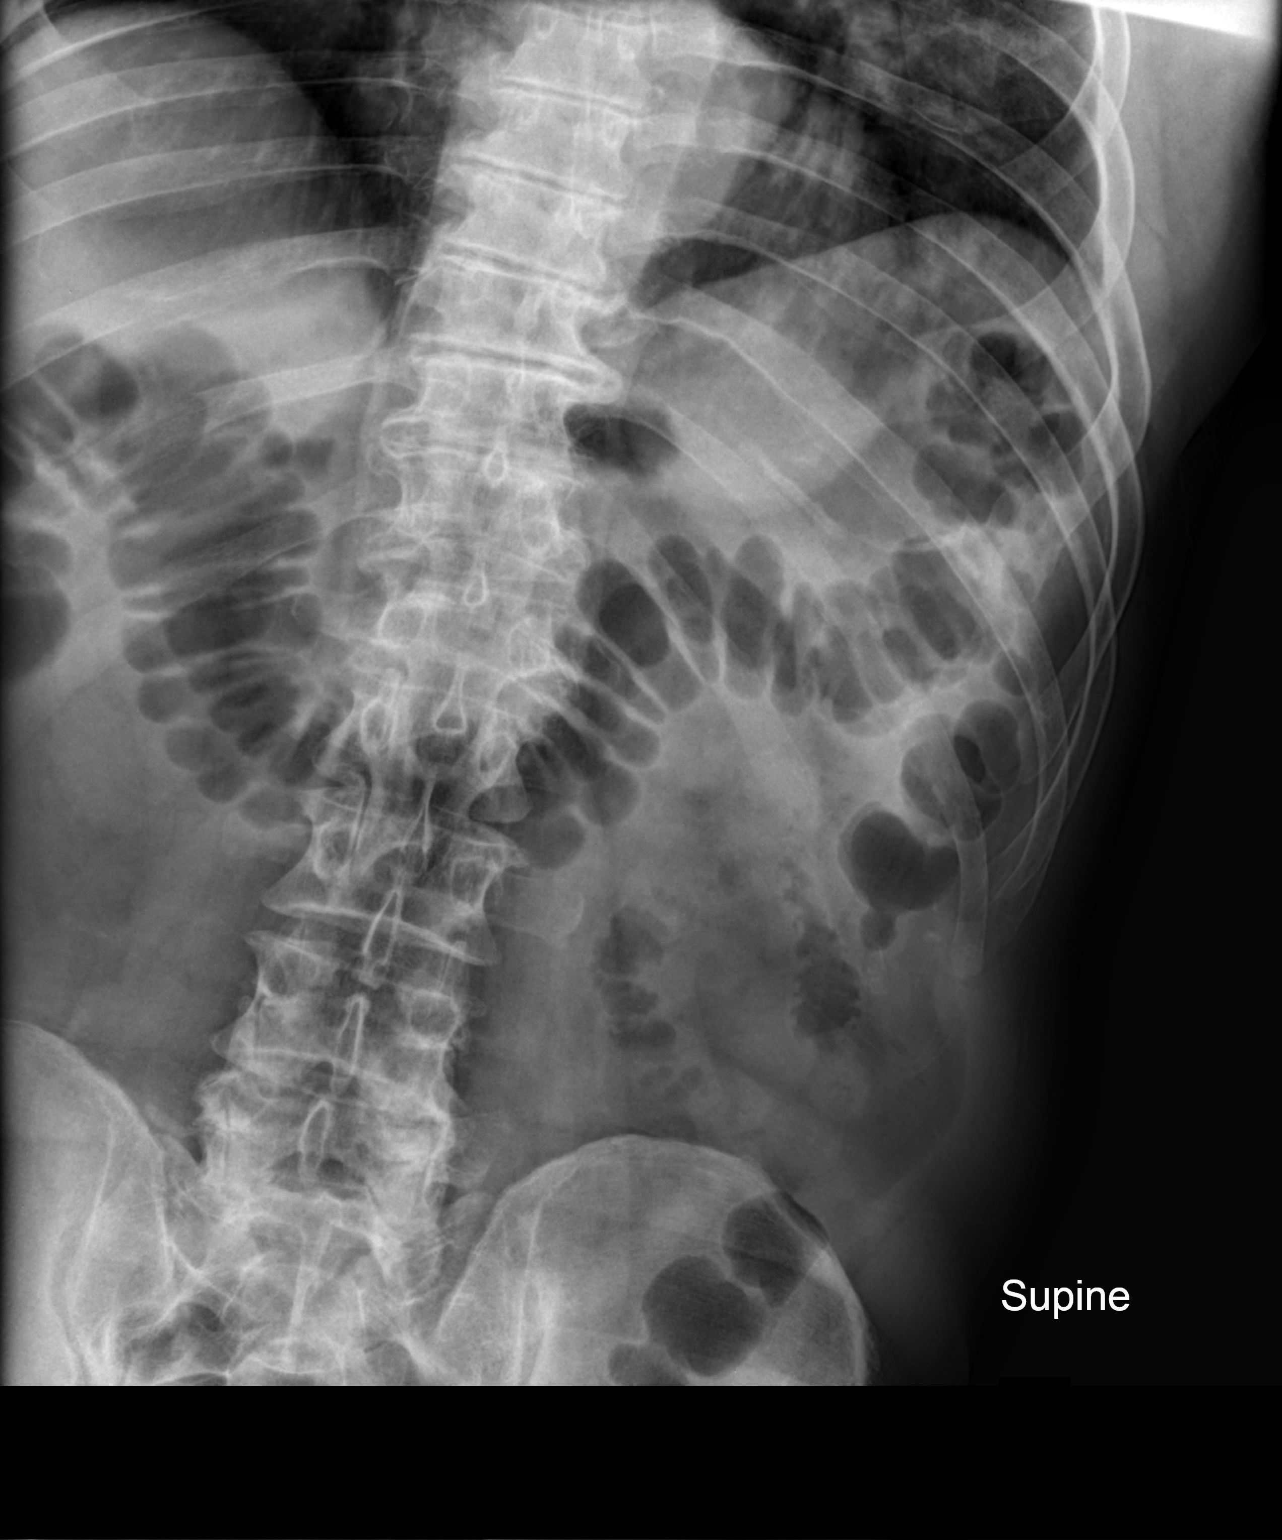

[2 of 2 positions shown; findings below may reference images not displayed]

FINDINGS: Supine views of the abdomen demonstrate gas and stool
scattered throughout the colon extending to the level of the distal
rectum.  There are a few nondilated loops of gas-filled small bowel
projecting over the left upper quadrant of the abdomen.  No
pathologic distension of small bowel is noted.  No gross evidence
of pneumoperitoneum is noted on these limited supine views of the
abdomen.
IMPRESSION: 1.  Nonspecific, nonobstructive bowel gas pattern.
2.  No gross pneumoperitoneum.  Assessment is severely limited on
supine views of the abdomen.  This could be better evaluated with
an upright abdominal film or left lateral decubitus view of the
abdomen if clinically indicated.

## 2013-05-16 DIAGNOSIS — Z85828 Personal history of other malignant neoplasm of skin: Secondary | ICD-10-CM | POA: Diagnosis not present

## 2013-05-16 DIAGNOSIS — D234 Other benign neoplasm of skin of scalp and neck: Secondary | ICD-10-CM | POA: Diagnosis not present

## 2013-06-06 ENCOUNTER — Other Ambulatory Visit: Payer: Self-pay | Admitting: Internal Medicine

## 2013-07-11 ENCOUNTER — Other Ambulatory Visit: Payer: Self-pay | Admitting: Internal Medicine

## 2013-09-07 ENCOUNTER — Other Ambulatory Visit: Payer: Self-pay | Admitting: *Deleted

## 2013-09-07 ENCOUNTER — Other Ambulatory Visit: Payer: Self-pay | Admitting: Internal Medicine

## 2013-09-07 MED ORDER — FUROSEMIDE 20 MG PO TABS: ORAL_TABLET | ORAL | Status: DC

## 2013-09-14 DIAGNOSIS — D235 Other benign neoplasm of skin of trunk: Secondary | ICD-10-CM | POA: Diagnosis not present

## 2013-09-14 DIAGNOSIS — D2360 Other benign neoplasm of skin of unspecified upper limb, including shoulder: Secondary | ICD-10-CM | POA: Diagnosis not present

## 2013-09-14 DIAGNOSIS — Z85828 Personal history of other malignant neoplasm of skin: Secondary | ICD-10-CM | POA: Diagnosis not present

## 2013-10-04 DIAGNOSIS — Z23 Encounter for immunization: Secondary | ICD-10-CM | POA: Diagnosis not present

## 2013-12-14 ENCOUNTER — Other Ambulatory Visit: Payer: Self-pay

## 2013-12-14 MED ORDER — FUROSEMIDE 20 MG PO TABS: ORAL_TABLET | ORAL | Status: DC

## 2013-12-21 DIAGNOSIS — M19029 Primary osteoarthritis, unspecified elbow: Secondary | ICD-10-CM | POA: Diagnosis not present

## 2014-01-17 ENCOUNTER — Other Ambulatory Visit: Payer: Self-pay | Admitting: Internal Medicine

## 2014-09-17 DIAGNOSIS — Z23 Encounter for immunization: Secondary | ICD-10-CM | POA: Diagnosis not present

## 2014-10-01 ENCOUNTER — Ambulatory Visit: Payer: Medicare Other | Admitting: Internal Medicine

## 2014-10-05 ENCOUNTER — Ambulatory Visit: Payer: Medicare Other | Admitting: Family

## 2014-11-20 ENCOUNTER — Other Ambulatory Visit (INDEPENDENT_AMBULATORY_CARE_PROVIDER_SITE_OTHER): Payer: Medicare Other

## 2014-11-20 ENCOUNTER — Encounter: Payer: Self-pay | Admitting: Internal Medicine

## 2014-11-20 ENCOUNTER — Ambulatory Visit (INDEPENDENT_AMBULATORY_CARE_PROVIDER_SITE_OTHER): Payer: Medicare Other | Admitting: Internal Medicine

## 2014-11-20 VITALS — BP 162/90 | HR 79 | Temp 98.2°F | Resp 16 | Ht 67.0 in | Wt 183.0 lb

## 2014-11-20 DIAGNOSIS — E789 Disorder of lipoprotein metabolism, unspecified: Secondary | ICD-10-CM | POA: Diagnosis not present

## 2014-11-20 DIAGNOSIS — M25511 Pain in right shoulder: Secondary | ICD-10-CM | POA: Diagnosis not present

## 2014-11-20 DIAGNOSIS — Z23 Encounter for immunization: Secondary | ICD-10-CM

## 2014-11-20 DIAGNOSIS — I10 Essential (primary) hypertension: Secondary | ICD-10-CM

## 2014-11-20 LAB — CBC
HEMATOCRIT: 49.4 % (ref 39.0–52.0)
HEMOGLOBIN: 16.5 g/dL (ref 13.0–17.0)
MCHC: 33.3 g/dL (ref 30.0–36.0)
MCV: 86.9 fl (ref 78.0–100.0)
Platelets: 224 10*3/uL (ref 150.0–400.0)
RBC: 5.69 Mil/uL (ref 4.22–5.81)
RDW: 14.1 % (ref 11.5–15.5)
WBC: 8.6 10*3/uL (ref 4.0–10.5)

## 2014-11-20 LAB — BASIC METABOLIC PANEL
BUN: 20 mg/dL (ref 6–23)
CALCIUM: 9.2 mg/dL (ref 8.4–10.5)
CO2: 30 meq/L (ref 19–32)
Chloride: 105 mEq/L (ref 96–112)
Creatinine, Ser: 1.15 mg/dL (ref 0.40–1.50)
GFR: 66.38 mL/min (ref >60.00)
Glucose, Bld: 117 mg/dL — ABNORMAL HIGH (ref 70–99)
Potassium: 3.8 mEq/L (ref 3.5–5.1)
Sodium: 139 mEq/L (ref 135–145)

## 2014-11-20 LAB — LDL CHOLESTEROL, DIRECT: Direct LDL: 81 mg/dL

## 2014-11-20 LAB — LIPID PANEL
CHOL/HDL RATIO: 4
Cholesterol: 143 mg/dL (ref 0–200)
HDL: 37.9 mg/dL — ABNORMAL LOW (ref >39.00)
NonHDL: 105.1
Triglycerides: 223 mg/dL — ABNORMAL HIGH (ref 0.0–149.0)
VLDL: 44.6 mg/dL — ABNORMAL HIGH (ref 0.0–40.0)

## 2014-11-20 MED ORDER — HYDROCHLOROTHIAZIDE 12.5 MG PO CAPS: 12.5000 mg | ORAL_CAPSULE | Freq: Every day | ORAL | Status: DC

## 2014-11-20 NOTE — Progress Notes (Signed)
Pre visit review using our clinic review tool, if applicable. No additional management support is needed unless otherwise documented below in the visit note. 

## 2014-11-20 NOTE — Patient Instructions (Signed)
We will give you the pneumonia vaccine today.   We have sent in the new blood pressure medicine today called hydrochlorothiazide. You will take 1 pill a day and should not notice any new side effects.   We will check your blood today to make sure there are no problems and will call you with the results.   Come back in about 1 year, if you have any problems or questions before then please feel free to call us.

## 2014-11-21 NOTE — Progress Notes (Signed)
   Subjective:    Patient ID: Antonio Ferrell, male    DOB: 1941-10-23, 74 y.o.   MRN: [id number]  HPI The patient is a 74 YO man who is coming in to establish care. He has PMH of osteoarthritis, HTN. He is still very active and goes to the gym 3 times per week. [place] so there is always work to be done. He feels to be in good health and does not struggle with many things. He does take ibuprofen for pain in his shoulder from degeneration over the years. He is concerned that this occasionally raises his BP. Usually it is around 140s/80s at home. Rarely it has been higher. He has gotten some screening over the years including carotid doppler (normal), ABI (normal), colonoscopy (normal).   Review of Systems  Constitutional: Negative for fever, activity change, appetite change and unexpected weight change.  HENT: Negative.   Respiratory: Negative for cough, chest tightness, shortness of breath and wheezing.   Cardiovascular: Negative for chest pain, palpitations and leg swelling.  Gastrointestinal: Negative for abdominal pain, diarrhea, constipation and abdominal distention.  Musculoskeletal: Positive for arthralgias. Negative for myalgias and gait problem.  Skin: Negative.   Neurological: Negative for dizziness, syncope, weakness, light-headedness and headaches.  Psychiatric/Behavioral: Negative.       Objective:   Physical Exam  Constitutional: He is oriented to person, place, and time. He appears well-developed and well-nourished.  HENT:  Head: Normocephalic and atraumatic.  Eyes: EOM are normal.  Neck: Normal range of motion.  Cardiovascular: Normal rate and regular rhythm.   Pulmonary/Chest: Effort normal and breath sounds normal. No respiratory distress. He has no wheezes.  Abdominal: Soft. Bowel sounds are normal. He exhibits no distension. There is no tenderness. There is no rebound.  Musculoskeletal: He exhibits tenderness.  Soreness and stiffness in both shoulders right  greater than left.   Neurological: He is alert and oriented to person, place, and time. Coordination normal.  Skin: Skin is warm and dry.   Filed Vitals:   11/20/14 1543  BP: 162/90  Pulse: 79  Temp: 98.2 F (36.8 C)  TempSrc: Oral  Resp: 16  Height: 5\' 7"  (1.702 m)  Weight: 183 lb (83.008 kg)  SpO2: 97%      Assessment & Plan:

## 2014-11-21 NOTE — Assessment & Plan Note (Signed)
Taking ibuprofen as needed and still doing well. Encouraged continued stretching as he is doing already.

## 2014-11-21 NOTE — Assessment & Plan Note (Signed)
BP resonable on lasix but given that he likes to come once a year for check up will use HCTZ as less risk hypokalemia. Check BMP today.

## 2015-01-03 ENCOUNTER — Telehealth: Payer: Self-pay | Admitting: Geriatric Medicine

## 2015-01-03 ENCOUNTER — Other Ambulatory Visit: Payer: Self-pay | Admitting: Geriatric Medicine

## 2015-01-03 NOTE — Telephone Encounter (Signed)
Should this patient still be taking furosemide? I have a refill request. Please advise, thanks.

## 2015-01-03 NOTE — Telephone Encounter (Signed)
No, should not be on lasix.

## 2015-04-04 ENCOUNTER — Other Ambulatory Visit: Payer: Self-pay | Admitting: Internal Medicine

## 2015-04-05 ENCOUNTER — Other Ambulatory Visit: Payer: Self-pay

## 2015-04-05 MED ORDER — VARDENAFIL HCL 10 MG PO TABS: ORAL_TABLET | ORAL | Status: DC

## 2015-04-05 NOTE — Telephone Encounter (Signed)
levitra rx sent to pharm

## 2015-09-19 DIAGNOSIS — D235 Other benign neoplasm of skin of trunk: Secondary | ICD-10-CM | POA: Diagnosis not present

## 2015-09-19 DIAGNOSIS — L821 Other seborrheic keratosis: Secondary | ICD-10-CM | POA: Diagnosis not present

## 2015-09-19 DIAGNOSIS — Z85828 Personal history of other malignant neoplasm of skin: Secondary | ICD-10-CM | POA: Diagnosis not present

## 2015-10-04 ENCOUNTER — Telehealth: Payer: Self-pay

## 2015-10-04 NOTE — Telephone Encounter (Signed)
Patient called to educate on Medicare Wellness apt. LVM for the patient to call back to educate and schedule for wellness visit.  Apt with Dr. Sharlet Salina on 12/09 at 1pm. To call if he can come in a little early to completed AWV.  Left my direct phone number

## 2015-10-07 NOTE — Telephone Encounter (Signed)
This patient called back and agreed to AWV at 12:30 on 12/9 and will be seeing dr. Quay Burow at [facility] him back to confirm.

## 2015-10-10 ENCOUNTER — Ambulatory Visit: Payer: Medicare Other | Admitting: Internal Medicine

## 2015-10-11 ENCOUNTER — Other Ambulatory Visit (INDEPENDENT_AMBULATORY_CARE_PROVIDER_SITE_OTHER): Payer: Medicare Other

## 2015-10-11 ENCOUNTER — Ambulatory Visit (INDEPENDENT_AMBULATORY_CARE_PROVIDER_SITE_OTHER): Payer: Medicare Other | Admitting: Internal Medicine

## 2015-10-11 ENCOUNTER — Encounter: Payer: Self-pay | Admitting: Internal Medicine

## 2015-10-11 ENCOUNTER — Ambulatory Visit: Payer: Medicare Other | Admitting: Internal Medicine

## 2015-10-11 VITALS — BP 178/110 | HR 70 | Temp 97.6°F | Ht 68.0 in | Wt 185.8 lb

## 2015-10-11 DIAGNOSIS — E789 Disorder of lipoprotein metabolism, unspecified: Secondary | ICD-10-CM | POA: Diagnosis not present

## 2015-10-11 DIAGNOSIS — I10 Essential (primary) hypertension: Secondary | ICD-10-CM | POA: Diagnosis not present

## 2015-10-11 DIAGNOSIS — R5383 Other fatigue: Secondary | ICD-10-CM

## 2015-10-11 DIAGNOSIS — Z23 Encounter for immunization: Secondary | ICD-10-CM

## 2015-10-11 DIAGNOSIS — Z0000 Encounter for general adult medical examination without abnormal findings: Secondary | ICD-10-CM

## 2015-10-11 LAB — COMPREHENSIVE METABOLIC PANEL
ALBUMIN: 4.1 g/dL (ref 3.5–5.2)
ALT: 15 U/L (ref 0–53)
AST: 23 U/L (ref 0–37)
Alkaline Phosphatase: 72 U/L (ref 39–117)
BUN: 17 mg/dL (ref 6–23)
CALCIUM: 9.4 mg/dL (ref 8.4–10.5)
CO2: 30 mEq/L (ref 19–32)
Chloride: 103 mEq/L (ref 96–112)
Creatinine, Ser: 1.06 mg/dL (ref 0.40–1.50)
GFR: 72.75 mL/min (ref >60.00)
Glucose, Bld: 93 mg/dL (ref 70–99)
POTASSIUM: 4 meq/L (ref 3.5–5.1)
Sodium: 139 mEq/L (ref 135–145)
TOTAL PROTEIN: 6.7 g/dL (ref 6.0–8.3)
Total Bilirubin: 0.9 mg/dL (ref 0.2–1.2)

## 2015-10-11 LAB — LIPID PANEL
CHOLESTEROL: 146 mg/dL (ref 0–200)
HDL: 40.7 mg/dL (ref >39.00)
LDL CALC: 76 mg/dL (ref 0–99)
NonHDL: 104.84
TRIGLYCERIDES: 146 mg/dL (ref 0.0–149.0)
Total CHOL/HDL Ratio: 4
VLDL: 29.2 mg/dL (ref 0.0–40.0)

## 2015-10-11 MED ORDER — HYDROCHLOROTHIAZIDE 25 MG PO TABS: 25.0000 mg | ORAL_TABLET | Freq: Every day | ORAL | Status: DC

## 2015-10-11 NOTE — Progress Notes (Signed)
Subjective:   Antonio Ferrell is a 74 y.o. male who presents for Medicare Annual/Subsequent preventive examination.  Review of Systems:  HRA assessment completed during visit; Happ The Patient was informed that this wellness visit is to identify risk and educate on how to reduce risk for increase disease through lifestyle changes.   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;  Exercise;  Big yard; farm keep grass cut; airplanes;  Has landing strip at farm; Stays very busy;  Gym x 3 to 4 days a week and 5 days in the winter;   Flight physical is critical; has to 155/95;    SAFETY/ one level; lives with spouse;  Safety reviewed for the home; including removal of clutter; clear paths through the home, eliminating clutter, railing as needed; bathroom safety; community safety; smoke detectors and firearms safety  Driving accidents no and seatbelt YES Sun protection/ sometimes;  Stressors; life is good  Medication review/ New meds  Fall assessment / no/ missed at step    Mobilization and Functional losses in the last year. Right shoulder pain;  Bone on bone; OA of shoulder; was taking ibuprofen; May take 3 in the middle of the night if he needs too 1-10; pain level when sitting is 0; lying on it at hs 3 or 4; X-rayed x 74 yo   Sleep patterns; sleeps fairly well, snores some   Urinary or fecal incontinence reviewed   Counseling: Colonoscopy; 03/24/2012 repeat in 10 years; 03/2022 EKG: 10/31/2011 Hearing: 4000 right and 2000 in left     Ophthalmology exam; has been a couple of years; double vision corrected at [place] x 74 yo;   Immunizations Due  Flu vaccine  Current Care Team reviewed and updated   Cardiac Risk Factors include: advanced age (>65men, >1 women);dyslipidemia;hypertension;male gender     Objective:    Vitals: Pulse 70  Temp(Src) 97.6 F (36.4 C) (Oral)  Ht 5\' 8"  (1.727 m)  Wt 185 lb 12 oz (84.256 kg)  BMI 28.25 kg/m2  SpO2 98%  Tobacco History  Smoking status  . Never Smoker   Smokeless tobacco  . Never Used     Counseling given: Not Answered   Past Medical History  Diagnosis Date  . Other dysphagia   . Nephrolithiasis   . GERD (gastroesophageal reflux disease)   . High blood pressure    Past Surgical History  Procedure Laterality Date  . Tonsillectomy    . Hernia repair      right groin   Family History  Problem Relation Age of Onset  . Lung cancer Mother   . Heart disease Father   . Heart attack Father   . Alcohol abuse Father   . Colon cancer Paternal Grandfather   . Esophageal cancer Neg Hx   . Stomach cancer Neg Hx   . Rectal cancer Neg Hx    History  Sexual Activity  . Sexual Activity: Not on file    Outpatient Encounter  Prescriptions as of 10/11/2015  Medication Sig  . aspirin 81 MG tablet Take 81 mg by mouth daily.    . hydrochlorothiazide (MICROZIDE) 12.5 MG capsule Take 1 capsule (12.5 mg total) by mouth daily.  Marland Kitchen ibuprofen (ADVIL,MOTRIN) 200 MG tablet Take 400 mg by mouth every 6 (six) hours as needed.  Marland Kitchen LEVITRA 10 MG tablet TAKE AS DIRECTED  . Melatonin 3 MG CAPS Take 1 capsule by mouth at bedtime.    Marland Kitchen omeprazole (PRILOSEC) 10 MG capsule Take 10 mg by mouth daily.  . furosemide (LASIX) 20 MG tablet Take 20 mg by mouth daily.  . [DISCONTINUED] vardenafil (LEVITRA) 10 MG tablet TAKE AS DIRECTED   No facility-administered encounter medications on file as of 10/11/2015.    Activities of Daily Living In your present state of health, do you have any  difficulty performing the following activities: 10/11/2015  Hearing? N  Vision? N  Difficulty concentrating or making decisions? N  Walking or climbing stairs? N  Dressing or bathing? N  Doing errands, shopping? N  Preparing Food and eating ? N  Using the Toilet? N  In the past six months, have you accidently leaked urine? N  Do you have problems with loss of bowel control? N  Managing your Medications? N  Managing your Finances? N  Housekeeping or managing your Housekeeping? N    Patient Care Team: Hoyt Koch, MD as PCP - General (Internal Medicine)   Assessment:    Assessment   Patient presents for yearly preventative medicine examination. Medicare questionnaire screening were completed, i.e. Functional; fall risk; depression, memory loss and hearing all unremarkable;  All immunizations and health maintenance protocols were reviewed with the patient and needed orders were placed. Had high dose flu shot  Education provided for laboratory screens;  Educated on lipids and trigl  Medication reconciliation, past medical history, social history, problem list and allergies were reviewed in detail with the patient  Wants to discuss BP and ? Low testosterone with Dr. Sharlet Salina; BP has been elevated and was 170/105 at home and later 152/100 but 170/ 120 in the office today;  Is not taking lasix but taking HCTZ. 12.5;  Goals were established with regard to maintaining health; Hesitant to give up sweets but did discuss sodium and given information on DASH diet   End of life planning was discussed and has been completed and will bring a copy for the chart.    Exercise Activities and Dietary recommendations Current Exercise Habits:: Structured exercise class;Home exercise routine, Time (Minutes): > 60, Frequency (Times/Week): 5, Weekly Exercise (Minutes/Week): 0, Intensity: Moderate  Goals    . maintain     Wants to stay healthy; weight down; off meds; have more energy;  Try  to cut back on sweets; DASH Eating Plan DASH stands for "Dietary Approaches to Stop Hypertension." The DASH eating plan is a healthy eating plan that has been shown to reduce high blood pressure (hypertension). Additional health benefits may include reducing the risk of type 2 diabetes mellitus, heart disease, and stroke. The DASH eating plan may also help with weight loss. WHAT DO I NEED TO KNOW ABOUT THE DASH EATING PLAN? For the DASH eating plan, you will follow these general guidelines:  Choose foods with a percent daily value for sodium of less than 5% (as listed on the food label).  Use salt-free seasonings or herbs instead of table salt or sea salt.  Check with your health care provider or pharmacist before using salt  substitutes.  Eat lower-sodium products, often labeled as "lower sodium" or "no salt added."  Eat fresh foods.  Eat more vegetables, fruits, and low-fat dairy products.  Choose whole grains. Look for the word "whole" as the first word in the ingredient list.  Choose fish and skinless chicken or [place] more often than red meat. Limit fish, poultry, and meat to 6 oz (170 g) each day.  Limit sweets, desserts, sugars, and sugary drinks.  Choose heart-healthy fats.  Limit cheese to 1 oz (28 g) per day.  Eat more home-cooked food and less restaurant, buffet, and fast food.  Limit fried foods.  Cook foods using methods other than frying.  Limit canned vegetables. If you do use them, rinse them well to decrease the sodium.  When eating at a restaurant, ask that your food be prepared with less salt, or no salt if possible. WHAT FOODS CAN I EAT? Seek help from a dietitian for individual calorie needs. Grains Whole grain or whole wheat bread. Brown rice. Whole grain or whole wheat pasta. Quinoa, bulgur, and whole grain cereals. Low-sodium cereals. Corn or whole wheat flour tortillas. Whole grain cornbread. Whole grain crackers. Low-sodium crackers. Vegetables Fresh  or frozen vegetables (raw, steamed, roasted, or grilled). Low-sodium or reduced-sodium tomato and vegetable juices. Low-sodium or reduced-sodium tomato sauce and paste. Low-sodium or reduced-sodium canned vegetables.  Fruits All fresh, canned (in natural juice), or frozen fruits. Meat and Other Protein Products Ground beef (85% or leaner), grass-fed beef, or beef trimmed of fat. Skinless chicken or [place]. Ground chicken or [place]. Pork trimmed of fat. All fish and seafood. Eggs. Dried beans, peas, or lentils. Unsalted nuts and seeds. Unsalted canned beans. Dairy Low-fat dairy products, such as skim or 1% milk, 2% or reduced-fat cheeses, low-fat ricotta or cottage cheese, or plain low-fat yogurt. Low-sodium or reduced-sodium cheeses. Fats and Oils Tub margarines without trans fats. Light or reduced-fat mayonnaise and salad dressings (reduced sodium). Avocado. Safflower, olive, or canola oils. Natural peanut or almond butter. Other Unsalted popcorn and pretzels. The items listed above may not be a complete list of recommended foods or beverages. Contact your dietitian for more options. WHAT FOODS ARE NOT RECOMMENDED? Grains White bread. White pasta. White rice. Refined cornbread. Bagels and croissants. Crackers that contain trans fat. Vegetables Creamed or fried vegetables. Vegetables in a cheese sauce. Regular canned vegetables. Regular canned tomato sauce and paste. Regular tomato and vegetable juices. Fruits Dried fruits. Canned fruit in light or heavy syrup. Fruit juice. Meat and Other Protein Products Fatty cuts of meat. Ribs, chicken wings, bacon, sausage, bologna, salami, chitterlings, fatback, hot dogs, bratwurst, and packaged luncheon meats. Salted nuts and seeds. Canned beans with salt. Dairy Whole or 2% milk, cream, half-and-half, and cream cheese. Whole-fat or sweetened yogurt. Full-fat cheeses or blue cheese. Nondairy creamers and whipped toppings. Processed cheese, cheese spreads,  or cheese curds. Condiments Onion and garlic salt, seasoned salt, table salt, and sea salt. Canned and packaged gravies. Worcestershire sauce. Tartar sauce. Barbecue sauce. Teriyaki sauce. Soy sauce, including reduced sodium. Steak sauce. Fish sauce. Oyster sauce. Cocktail sauce. Horseradish. Ketchup and mustard. Meat flavorings and tenderizers. Bouillon cubes. Hot sauce. Tabasco sauce. Marinades. Taco seasonings. Relishes. Fats and Oils Butter, stick margarine, lard, shortening, ghee, and bacon fat. Coconut, palm kernel, or palm oils. Regular salad dressings. Other Pickles and olives. Salted popcorn and pretzels. The items listed above may not be a complete list of foods and beverages to avoid. Contact your dietitian for more information. WHERE  CAN I FIND MORE INFORMATION? National Heart, Lung, and Blood Institute: travelstabloid.com   This information is not intended to replace advice given to you by your health care provider. Make sure you discuss any questions you have with your health care provider.   Document Released: 10/08/2011 Document Revised: 11/09/2014 Document Reviewed: 08/23/2013 Elsevier Interactive Patient Education 2016 [place]  10/11/2015  Falls in the past year? No   Depression Screen PHQ 2/9 Scores 10/11/2015  PHQ - 2 Score 0    Cognitive Testing No flowsheet data found.  Denies Ad8 issues;   Immunization History  Administered Date(s) Administered  . Influenza Split 09/01/2011  . Influenza,inj,Quad PF,36+ Mos 09/17/2014  . Pneumococcal Polysaccharide-23 11/20/2014  . Tdap 10/19/2011  . Zoster 02/20/2008   Screening Tests Health Maintenance  Topic Date Due  . INFLUENZA VACCINE  06/03/2015  . PNA vac Low Risk Adult (2 of 2 - PCV13) 11/21/2015  . TETANUS/TDAP  10/18/2021  . COLONOSCOPY  03/24/2022  . ZOSTAVAX  Completed      Plan:     To take the high dose flu shot today  During the  course of the visit the patient was educated and counseled about the following appropriate screening and preventive services:   Vaccines to include Pneumoccal, Influenza, Hepatitis B, Td, Zostavax, HCV  Electrocardiogram/ deferred   Cardiovascular Disease/ discussed HTN and sodium control  Colorectal cancer screening/ 03/2022  Diabetes screening/ neg  Prostate Cancer Screening/deferred  Glaucoma screening; needs vision; discussed getting eyes checked q 2 years  Nutrition counseling / discussed high Trig as well as low sodium   Smoking cessation counseling n/a  Patient Instructions (the written plan) was given to the patient.    Wynetta Fines, RN  10/11/2015

## 2015-10-11 NOTE — Patient Instructions (Signed)
We have given you the samples of the viagra to see if it works better. If so call us and we will send in a prescription.   We are checking the blood work today and will call back with the results.   We have given you the flu shot today.   We have increased your blood pressure medicine and have sent in the new prescription. Until you run out take 2 pills of the blood pressure daily.

## 2015-10-12 ENCOUNTER — Encounter: Payer: Self-pay | Admitting: Internal Medicine

## 2015-10-12 NOTE — Assessment & Plan Note (Signed)
BP is higher than usual and he did not take this morning. Overall has been high at home (140-150/90s) and needs increase in regimen. Increase HCTZ to 25 mg daily. Checking CMP today and adjust as needed. Can combo with an ACE-I if needed for better control if not controlled on hctz alone. No symptoms.

## 2015-10-12 NOTE — Progress Notes (Signed)
Medical screening examination/treatment/procedure(s) were performed by non-physician practitioner and as supervising physician I was immediately available for consultation/collaboration. I agree with above. Christan Defranco A Joey Hudock, MD 

## 2015-10-12 NOTE — Progress Notes (Signed)
   Subjective:    Patient ID: Antonio Ferrell, male    DOB: Jan 25, 1941, 74 y.o.   MRN: [id number]  HPI The patient is a 74 YO man coming in for high blood pressure. He is happier on the HCTZ than the lasix but his BP is still a little high. He has been having trouble with high blood pressure for some time (several years). 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 No headaches, chest pains, SOB, abdominal pain, diarrhea constipation. Denies taking NSAIDS.   Review of Systems  Constitutional: Negative for fever, activity change, appetite change and unexpected weight change.  HENT: Negative.   Respiratory: Negative for cough, chest tightness, shortness of breath and wheezing.   Cardiovascular: Negative for chest pain, palpitations and leg swelling.  Gastrointestinal: Negative for abdominal pain, diarrhea, constipation and abdominal distention.  Musculoskeletal: Positive for arthralgias. Negative for myalgias and gait problem.  Skin: Negative.   Neurological: Negative for dizziness, syncope, weakness, light-headedness and headaches.  Psychiatric/Behavioral: Negative.       Objective:   Physical Exam  Constitutional: He is oriented to person, place, and time. He appears well-developed and well-nourished.  HENT:  Head: Normocephalic and atraumatic.  Eyes: EOM are normal.  Neck: Normal range of motion.  Cardiovascular: Normal rate and regular rhythm.   Pulmonary/Chest: Effort normal and breath sounds normal. No respiratory distress. He has no wheezes.  Abdominal: Soft. Bowel sounds are normal. He exhibits no distension. There is no tenderness. There is no rebound.  Neurological: He is alert and oriented to person, place, and time. Coordination normal.  Skin: Skin is warm and dry.   Filed Vitals:   10/11/15 1236 10/11/15 1334  BP: 170/120 178/110  Pulse: 70   Temp: 97.6 F (36.4 C)   TempSrc: Oral   Height: 5\' 8"  (1.727 m)   Weight:  185 lb 12 oz (84.256 kg)   SpO2: 98%       Assessment & Plan:  Flu shot given at visit.

## 2015-10-14 LAB — TESTOSTERONE, FREE, TOTAL, SHBG
SEX HORMONE BINDING: 33 nmol/L (ref 22–77)
TESTOSTERONE FREE: 40.4 pg/mL — AB (ref 47.0–244.0)
TESTOSTERONE: 212 ng/dL — AB (ref 300–890)
Testosterone-% Free: 1.9 % (ref 1.6–2.9)

## 2015-10-15 DIAGNOSIS — D234 Other benign neoplasm of skin of scalp and neck: Secondary | ICD-10-CM | POA: Diagnosis not present

## 2015-10-15 DIAGNOSIS — D235 Other benign neoplasm of skin of trunk: Secondary | ICD-10-CM | POA: Diagnosis not present

## 2015-10-15 DIAGNOSIS — D2362 Other benign neoplasm of skin of left upper limb, including shoulder: Secondary | ICD-10-CM | POA: Diagnosis not present

## 2015-10-15 DIAGNOSIS — Z85828 Personal history of other malignant neoplasm of skin: Secondary | ICD-10-CM | POA: Diagnosis not present

## 2015-10-18 ENCOUNTER — Telehealth: Payer: Self-pay

## 2015-10-18 DIAGNOSIS — R7989 Other specified abnormal findings of blood chemistry: Secondary | ICD-10-CM

## 2015-10-18 NOTE — Telephone Encounter (Signed)
Call to Antonio Ferrell and he did receive call from endo and apt is sch for Jan 3rd to review for testosterone.

## 2015-10-18 NOTE — Telephone Encounter (Signed)
We have placed referral to endocrinology (they do testosterone replacement).

## 2015-10-18 NOTE — Telephone Encounter (Signed)
Antonio Ferrell called regarding his low testosterone and would like some form of replacement if appropriate for him. Please call at [phone] and advise

## 2015-10-23 ENCOUNTER — Other Ambulatory Visit: Payer: Self-pay | Admitting: Internal Medicine

## 2015-11-05 ENCOUNTER — Encounter: Payer: Self-pay | Admitting: Endocrinology

## 2015-11-05 ENCOUNTER — Ambulatory Visit (INDEPENDENT_AMBULATORY_CARE_PROVIDER_SITE_OTHER): Payer: Medicare Other | Admitting: Endocrinology

## 2015-11-05 VITALS — BP 143/94 | HR 78 | Temp 98.3°F | Ht 68.0 in | Wt 184.0 lb

## 2015-11-05 DIAGNOSIS — Z125 Encounter for screening for malignant neoplasm of prostate: Secondary | ICD-10-CM | POA: Diagnosis not present

## 2015-11-05 DIAGNOSIS — E291 Testicular hypofunction: Secondary | ICD-10-CM | POA: Insufficient documentation

## 2015-11-05 LAB — IBC PANEL
Iron: 129 ug/dL (ref 42–165)
SATURATION RATIOS: 33.1 % (ref 20.0–50.0)
Transferrin: 278 mg/dL (ref 212.0–360.0)

## 2015-11-05 LAB — LUTEINIZING HORMONE: LH: 4.17 m[IU]/mL (ref 3.10–34.60)

## 2015-11-05 LAB — TSH: TSH: 3.92 u[IU]/mL (ref 0.35–4.50)

## 2015-11-05 LAB — PSA, MEDICARE: PSA: 1.35 ng/mL (ref 0.10–4.00)

## 2015-11-05 NOTE — Progress Notes (Signed)
Subjective:    Patient ID: Antonio Ferrell, male    DOB: 10/30/1941, 75 y.o.   MRN: [id number]  HPI Pt reports he had puberty at the normal age.  He has no biological children, by choice.  He says he has never taken illicit androgens.  He has never been on any prescribed medication for hypogonadism.  He does not take antiandrogens or opioids.  He denies any h/o infertility, XRT, or genital infection.  He has never had surgery, or a serious injury to the head or genital area.  He no longer consumes alcohol excessively.  He reports moderate ED sxs, and assoc fatigue.   Past Medical History  Diagnosis Date  . Other dysphagia   . Nephrolithiasis   . GERD (gastroesophageal reflux disease)   . High blood pressure     Past Surgical History  Procedure Laterality Date  . Tonsillectomy    . Hernia repair      right groin    Social History   Social History  . Marital Status: Married    Spouse Name: N/A  . Number of Children: 0  . Years of Education: N/A   Occupational History  . retired    Social History Main Topics  . Smoking status: Never Smoker   . Smokeless tobacco: Never Used  . Alcohol Use: No     Comment: rare use  . Drug Use: No  . Sexual Activity: Not on file   Other Topics Concern  . 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 No children   Writer. Refurbished and restored a hypercub    Current Outpatient Prescriptions on File Prior to Visit  Medication Sig Dispense Refill  . aspirin 81 MG tablet Take 81 mg by mouth daily.      . hydrochlorothiazide (HYDRODIURIL) 25 MG tablet Take 1 tablet (25 mg total) by mouth daily. 90 tablet 3  . ibuprofen (ADVIL,MOTRIN) 200 MG tablet Take 400 mg by mouth every 6 (six) hours as  needed.    Marland Kitchen LEVITRA 10 MG tablet TAKE AS DIRECTED 18 tablet 1  . Melatonin 3 MG CAPS Take 1 capsule by mouth at bedtime.      Marland Kitchen omeprazole (PRILOSEC) 10 MG capsule Take 10 mg by mouth daily.     No current facility-administered medications on file prior to visit.    No Known Allergies  Family History  Problem Relation Age of Onset  . Lung cancer Mother   . Heart disease Father   . Heart attack Father   . Alcohol abuse Father   . Colon cancer Paternal Grandfather   . Esophageal cancer Neg Hx   . Stomach cancer Neg Hx   . Rectal cancer Neg Hx   . Other Neg Hx     hypogonadism    BP 143/94 mmHg  Pulse 78  Temp(Src) 98.3 F (36.8 C) (Oral)  Ht 5\' 8"  (1.727 m)  Wt 184 lb (83.462 kg)  BMI 27.98 kg/m2  SpO2 97%   Review of Systems denies depression, numbness, decreased urinary stream, weight change, gynecomastia, muscle weakness, fever, headache, easy bruising, sob, rash, blurry vision, rhinorrhea, chest pain.      Objective:   Physical Exam VS: see vs page  GEN: no distress HEAD: head: no deformity eyes: no periorbital swelling, no proptosis external nose and ears are normal mouth: no lesion seen NECK: supple, thyroid is not enlarged CHEST WALL: no deformity LUNGS: clear to auscultation BREASTS:  No gynecomastia CV: reg rate and rhythm, no murmur ABD: abdomen is soft, nontender.  no hepatosplenomegaly.  not distended.  no hernia GENITALIA:  Normal male.   MUSCULOSKELETAL: muscle bulk and strength are grossly normal.  no obvious joint swelling.  gait is normal and steady EXTEMITIES: no deformity.  no edema PULSES: no carotid bruit NEURO:  cn 2-12 grossly intact.   readily moves all 4's.  sensation is intact to touch on all 4's. SKIN:  Normal texture and temperature.  No rash or suspicious lesion is visible.  Normal hair distribution.   NODES:  None palpable at the neck PSYCH: alert, well-oriented.  Does not appear anxious nor depressed.  Lab Results  Component  Value Date   TSH 1.66 08/27/2006   Lab Results  Component Value Date   PSA 1.00 08/27/2006   PSA 1.0 08/27/2006   Lab Results  Component Value Date   TESTOSTERONE 214* 11/05/2015   I have reviewed outside records, and summarized: Pt was noted to have low testosterone, and referred here.  LH=5 Prolactin=normal.     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  same medication for now  Patient is advised the following: Patient Instructions  blood tests are requested for you today.  We'll let you know about the results. If it is low again, it is up to you if you want to take medication for this.    addendum: i have sent a prescription to your pharmacy, for clomid Recheck testosterone in 1 month.

## 2015-11-05 NOTE — Patient Instructions (Signed)
blood tests are requested for you today.  We'll let you know about the results. If it is low again, it is up to you if you want to take medication for this.

## 2015-11-06 ENCOUNTER — Encounter: Payer: Self-pay | Admitting: Endocrinology

## 2015-11-06 LAB — PROLACTIN: PROLACTIN: 6.9 ng/mL (ref 2.1–17.1)

## 2015-11-06 LAB — TESTOSTERONE,FREE AND TOTAL
Testosterone, Free: 3.3 pg/mL — ABNORMAL LOW (ref 6.6–18.1)
Testosterone: 214 ng/dL — ABNORMAL LOW (ref 348–1197)

## 2015-11-07 ENCOUNTER — Other Ambulatory Visit: Payer: Self-pay | Admitting: Endocrinology

## 2015-11-07 ENCOUNTER — Encounter: Payer: Self-pay | Admitting: Endocrinology

## 2015-11-07 DIAGNOSIS — E291 Testicular hypofunction: Secondary | ICD-10-CM

## 2015-11-07 MED ORDER — CLOMIPHENE CITRATE 50 MG PO TABS: ORAL_TABLET | ORAL | Status: DC

## 2015-11-14 DIAGNOSIS — Z85828 Personal history of other malignant neoplasm of skin: Secondary | ICD-10-CM | POA: Diagnosis not present

## 2015-11-14 DIAGNOSIS — D2339 Other benign neoplasm of skin of other parts of face: Secondary | ICD-10-CM | POA: Diagnosis not present

## 2015-12-03 DIAGNOSIS — Z85828 Personal history of other malignant neoplasm of skin: Secondary | ICD-10-CM | POA: Diagnosis not present

## 2015-12-03 DIAGNOSIS — D2339 Other benign neoplasm of skin of other parts of face: Secondary | ICD-10-CM | POA: Diagnosis not present

## 2015-12-11 ENCOUNTER — Other Ambulatory Visit: Payer: Medicare Other

## 2015-12-11 DIAGNOSIS — E291 Testicular hypofunction: Secondary | ICD-10-CM | POA: Diagnosis not present

## 2015-12-12 LAB — TESTOSTERONE,FREE AND TOTAL
TESTOSTERONE: 374 ng/dL (ref 348–1197)
Testosterone, Free: 6.6 pg/mL (ref 6.6–18.1)

## 2015-12-13 ENCOUNTER — Telehealth: Payer: Self-pay | Admitting: Endocrinology

## 2015-12-13 MED ORDER — CLOMIPHENE CITRATE 50 MG PO TABS: ORAL_TABLET | ORAL | Status: DC

## 2015-12-13 NOTE — Telephone Encounter (Signed)
Pt needs his clomiphene to be called into [facility] in [place] not to [facility]. He states he gave a card to nurse when he came in  Pt is also awaiting the results of his labs to be sent to my chart

## 2015-12-13 NOTE — Telephone Encounter (Signed)
See note below. I refilled the clomid to [facility]. Could you release the blood tests to MyChart? Thanks!

## 2015-12-13 NOTE — Telephone Encounter (Signed)
done

## 2015-12-24 DIAGNOSIS — D235 Other benign neoplasm of skin of trunk: Secondary | ICD-10-CM | POA: Diagnosis not present

## 2015-12-24 DIAGNOSIS — D2321 Other benign neoplasm of skin of right ear and external auricular canal: Secondary | ICD-10-CM | POA: Diagnosis not present

## 2015-12-24 DIAGNOSIS — Z85828 Personal history of other malignant neoplasm of skin: Secondary | ICD-10-CM | POA: Diagnosis not present

## 2015-12-24 DIAGNOSIS — D2361 Other benign neoplasm of skin of right upper limb, including shoulder: Secondary | ICD-10-CM | POA: Diagnosis not present

## 2016-01-09 DIAGNOSIS — M19011 Primary osteoarthritis, right shoulder: Secondary | ICD-10-CM | POA: Diagnosis not present

## 2016-01-14 DIAGNOSIS — Z85828 Personal history of other malignant neoplasm of skin: Secondary | ICD-10-CM | POA: Diagnosis not present

## 2016-01-14 DIAGNOSIS — D235 Other benign neoplasm of skin of trunk: Secondary | ICD-10-CM | POA: Diagnosis not present

## 2016-02-05 DIAGNOSIS — H25813 Combined forms of age-related cataract, bilateral: Secondary | ICD-10-CM | POA: Diagnosis not present

## 2016-02-05 DIAGNOSIS — H4912 Fourth [trochlear] nerve palsy, left eye: Secondary | ICD-10-CM | POA: Diagnosis not present

## 2016-02-18 DIAGNOSIS — Z719 Counseling, unspecified: Secondary | ICD-10-CM | POA: Diagnosis not present

## 2016-02-18 DIAGNOSIS — Z85828 Personal history of other malignant neoplasm of skin: Secondary | ICD-10-CM | POA: Diagnosis not present

## 2016-02-18 DIAGNOSIS — D235 Other benign neoplasm of skin of trunk: Secondary | ICD-10-CM | POA: Diagnosis not present

## 2016-02-18 DIAGNOSIS — D2339 Other benign neoplasm of skin of other parts of face: Secondary | ICD-10-CM | POA: Diagnosis not present

## 2016-02-18 DIAGNOSIS — L820 Inflamed seborrheic keratosis: Secondary | ICD-10-CM | POA: Diagnosis not present

## 2016-03-17 DIAGNOSIS — D2322 Other benign neoplasm of skin of left ear and external auricular canal: Secondary | ICD-10-CM | POA: Diagnosis not present

## 2016-03-17 DIAGNOSIS — Z719 Counseling, unspecified: Secondary | ICD-10-CM | POA: Diagnosis not present

## 2016-03-17 DIAGNOSIS — D2312 Other benign neoplasm of skin of left eyelid, including canthus: Secondary | ICD-10-CM | POA: Diagnosis not present

## 2016-03-17 DIAGNOSIS — D2321 Other benign neoplasm of skin of right ear and external auricular canal: Secondary | ICD-10-CM | POA: Diagnosis not present

## 2016-03-17 DIAGNOSIS — D2339 Other benign neoplasm of skin of other parts of face: Secondary | ICD-10-CM | POA: Diagnosis not present

## 2016-04-14 DIAGNOSIS — M19011 Primary osteoarthritis, right shoulder: Secondary | ICD-10-CM | POA: Diagnosis not present

## 2016-05-12 DIAGNOSIS — D2322 Other benign neoplasm of skin of left ear and external auricular canal: Secondary | ICD-10-CM | POA: Diagnosis not present

## 2016-05-12 DIAGNOSIS — D2321 Other benign neoplasm of skin of right ear and external auricular canal: Secondary | ICD-10-CM | POA: Diagnosis not present

## 2016-05-12 DIAGNOSIS — Z719 Counseling, unspecified: Secondary | ICD-10-CM | POA: Diagnosis not present

## 2016-05-12 DIAGNOSIS — D485 Neoplasm of uncertain behavior of skin: Secondary | ICD-10-CM | POA: Diagnosis not present

## 2016-05-12 DIAGNOSIS — Z85828 Personal history of other malignant neoplasm of skin: Secondary | ICD-10-CM | POA: Diagnosis not present

## 2016-05-12 DIAGNOSIS — D2339 Other benign neoplasm of skin of other parts of face: Secondary | ICD-10-CM | POA: Diagnosis not present

## 2016-05-12 DIAGNOSIS — D2361 Other benign neoplasm of skin of right upper limb, including shoulder: Secondary | ICD-10-CM | POA: Diagnosis not present

## 2016-05-12 DIAGNOSIS — L821 Other seborrheic keratosis: Secondary | ICD-10-CM | POA: Diagnosis not present

## 2016-05-12 DIAGNOSIS — D230 Other benign neoplasm of skin of lip: Secondary | ICD-10-CM | POA: Diagnosis not present

## 2016-05-12 DIAGNOSIS — D2362 Other benign neoplasm of skin of left upper limb, including shoulder: Secondary | ICD-10-CM | POA: Diagnosis not present

## 2016-05-12 DIAGNOSIS — L820 Inflamed seborrheic keratosis: Secondary | ICD-10-CM | POA: Diagnosis not present

## 2016-06-15 ENCOUNTER — Encounter: Payer: Self-pay | Admitting: Endocrinology

## 2016-06-15 ENCOUNTER — Ambulatory Visit (INDEPENDENT_AMBULATORY_CARE_PROVIDER_SITE_OTHER): Payer: Medicare Other | Admitting: Endocrinology

## 2016-06-15 VITALS — BP 154/87 | HR 84 | Temp 98.8°F | Ht 68.0 in | Wt 183.8 lb

## 2016-06-15 DIAGNOSIS — E1310 Other specified diabetes mellitus with ketoacidosis without coma: Secondary | ICD-10-CM

## 2016-06-15 DIAGNOSIS — E291 Testicular hypofunction: Secondary | ICD-10-CM | POA: Diagnosis not present

## 2016-06-15 LAB — POCT GLYCOSYLATED HEMOGLOBIN (HGB A1C): Hemoglobin A1C: 5.3

## 2016-06-15 NOTE — Progress Notes (Signed)
Pre visit review using our clinic tool,if applicable. No additional management support is needed unless otherwise documented below in the visit note.  

## 2016-06-15 NOTE — Progress Notes (Signed)
Subjective:    Patient ID: Antonio Ferrell, male    DOB: 1941-05-26, 75 y.o.   MRN: [id number]  HPI Pt returns for f/u of idiopathic central hypogonadism (dx'ed 2017; he has no biological children, by choice; he says he has never taken illicit androgens; he was rx'ed clomid).  Since on clomid, he feels somewhat better in general.   Past Medical History:  Diagnosis Date  . GERD (gastroesophageal reflux disease)   . High blood pressure   . Nephrolithiasis   . Other dysphagia     Past Surgical History:  Procedure Laterality Date  . HERNIA REPAIR     right groin  . TONSILLECTOMY      Social History   Social History  . Marital status: Married    Spouse name: N/A  . Number of children: 0  . Years of education: N/A   Occupational History  . retired    Social History Main Topics  . Smoking status: Never Smoker  . Smokeless tobacco: Never Used  . Alcohol use No     Comment: rare use  . Drug use: No  . Sexual activity: Not on file   Other Topics Concern  . 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 No children   Writer. Refurbished and restored a hypercub    Current Outpatient Prescriptions on File Prior to Visit  Medication Sig Dispense Refill  . aspirin 81 MG tablet Take 81 mg by mouth daily.      . clomiPHENE (CLOMID) 50 MG tablet 1/4 tab daily 10 tablet 5  . hydrochlorothiazide (HYDRODIURIL) 25 MG tablet Take 1 tablet (25 mg total) by mouth daily. 90 tablet 3  . ibuprofen (ADVIL,MOTRIN) 200 MG tablet Take 400 mg by mouth every 6 (six) hours as needed.    Marland Kitchen LEVITRA 10 MG tablet TAKE AS DIRECTED 18 tablet 1  . omeprazole (PRILOSEC) 10 MG capsule Take 10 mg by mouth daily.    . Melatonin 3 MG CAPS Take 1 capsule by mouth  at bedtime.       No current facility-administered medications on file prior to visit.     No Known Allergies  Family History  Problem Relation Age of Onset  . Lung cancer Mother   . Heart disease Father   . Heart attack Father   . Alcohol abuse Father   . Colon cancer Paternal Grandfather   . Esophageal cancer Neg Hx   . Stomach cancer Neg Hx   . Rectal cancer Neg Hx   . Other Neg Hx     hypogonadism    BP (!) 154/87   Pulse 84   Temp 98.8 F (37.1 C) (Oral)   Ht 5\' 8"  (1.727 m)   Wt 183 lb 12.8 oz (83.4 kg)   SpO2 98%   BMI 27.95 kg/m   Review of Systems Denies decreased urinary stream.      Objective:   Physical Exam VITAL SIGNS:  See vs page GENERAL: no distress Ext: no edema   Lab Results  Component Value Date   TSH 3.92 11/05/2015   Lab Results  Component Value Date   TESTOSTERONE 388 06/15/2016      Assessment & Plan:  Hypogonadism: well-controlled.  Please continue the same medication.   

## 2016-06-15 NOTE — Patient Instructions (Signed)
blood tests are requested for you today.  We'll let you know about the results. Testosterone treatment has risks, including increased or decreased fertility (depending on the type of treatment), hair loss, prostate cancer, benign prostate enlargement, blood clots, liver problems, lower hdl ("good cholesterol"), polycythemia (opposite of anemia), sleep apnea, and behavior changes. Weight loss also helps the testosterone. Please return in 1 year.

## 2016-06-16 LAB — TESTOSTERONE,FREE AND TOTAL
Testosterone, Free: 6.6 pg/mL (ref 6.6–18.1)
Testosterone: 388 ng/dL (ref 264–916)

## 2016-07-22 DIAGNOSIS — M19011 Primary osteoarthritis, right shoulder: Secondary | ICD-10-CM | POA: Diagnosis not present

## 2016-07-29 DIAGNOSIS — Z872 Personal history of diseases of the skin and subcutaneous tissue: Secondary | ICD-10-CM | POA: Diagnosis not present

## 2016-07-29 DIAGNOSIS — D2321 Other benign neoplasm of skin of right ear and external auricular canal: Secondary | ICD-10-CM | POA: Diagnosis not present

## 2016-07-29 DIAGNOSIS — D2339 Other benign neoplasm of skin of other parts of face: Secondary | ICD-10-CM | POA: Diagnosis not present

## 2016-07-29 DIAGNOSIS — D2322 Other benign neoplasm of skin of left ear and external auricular canal: Secondary | ICD-10-CM | POA: Diagnosis not present

## 2016-07-29 DIAGNOSIS — D234 Other benign neoplasm of skin of scalp and neck: Secondary | ICD-10-CM | POA: Diagnosis not present

## 2016-07-29 DIAGNOSIS — Z719 Counseling, unspecified: Secondary | ICD-10-CM | POA: Diagnosis not present

## 2016-07-29 DIAGNOSIS — D230 Other benign neoplasm of skin of lip: Secondary | ICD-10-CM | POA: Diagnosis not present

## 2016-07-29 DIAGNOSIS — D2362 Other benign neoplasm of skin of left upper limb, including shoulder: Secondary | ICD-10-CM | POA: Diagnosis not present

## 2016-07-29 DIAGNOSIS — L820 Inflamed seborrheic keratosis: Secondary | ICD-10-CM | POA: Diagnosis not present

## 2016-07-29 DIAGNOSIS — D2361 Other benign neoplasm of skin of right upper limb, including shoulder: Secondary | ICD-10-CM | POA: Diagnosis not present

## 2016-07-29 DIAGNOSIS — Z85828 Personal history of other malignant neoplasm of skin: Secondary | ICD-10-CM | POA: Diagnosis not present

## 2016-07-29 DIAGNOSIS — D235 Other benign neoplasm of skin of trunk: Secondary | ICD-10-CM | POA: Diagnosis not present

## 2016-09-07 DIAGNOSIS — D234 Other benign neoplasm of skin of scalp and neck: Secondary | ICD-10-CM | POA: Diagnosis not present

## 2016-09-07 DIAGNOSIS — Z719 Counseling, unspecified: Secondary | ICD-10-CM | POA: Diagnosis not present

## 2016-09-07 DIAGNOSIS — Z85828 Personal history of other malignant neoplasm of skin: Secondary | ICD-10-CM | POA: Diagnosis not present

## 2016-09-07 DIAGNOSIS — Z872 Personal history of diseases of the skin and subcutaneous tissue: Secondary | ICD-10-CM | POA: Diagnosis not present

## 2016-09-07 DIAGNOSIS — D230 Other benign neoplasm of skin of lip: Secondary | ICD-10-CM | POA: Diagnosis not present

## 2016-09-07 DIAGNOSIS — D2321 Other benign neoplasm of skin of right ear and external auricular canal: Secondary | ICD-10-CM | POA: Diagnosis not present

## 2016-09-07 DIAGNOSIS — D2339 Other benign neoplasm of skin of other parts of face: Secondary | ICD-10-CM | POA: Diagnosis not present

## 2016-09-07 DIAGNOSIS — D2322 Other benign neoplasm of skin of left ear and external auricular canal: Secondary | ICD-10-CM | POA: Diagnosis not present

## 2016-10-06 DIAGNOSIS — D235 Other benign neoplasm of skin of trunk: Secondary | ICD-10-CM | POA: Diagnosis not present

## 2016-10-06 DIAGNOSIS — D230 Other benign neoplasm of skin of lip: Secondary | ICD-10-CM | POA: Diagnosis not present

## 2016-10-06 DIAGNOSIS — D2339 Other benign neoplasm of skin of other parts of face: Secondary | ICD-10-CM | POA: Diagnosis not present

## 2016-10-06 DIAGNOSIS — D234 Other benign neoplasm of skin of scalp and neck: Secondary | ICD-10-CM | POA: Diagnosis not present

## 2016-10-06 DIAGNOSIS — D2321 Other benign neoplasm of skin of right ear and external auricular canal: Secondary | ICD-10-CM | POA: Diagnosis not present

## 2016-10-06 DIAGNOSIS — D2362 Other benign neoplasm of skin of left upper limb, including shoulder: Secondary | ICD-10-CM | POA: Diagnosis not present

## 2016-10-06 DIAGNOSIS — Z872 Personal history of diseases of the skin and subcutaneous tissue: Secondary | ICD-10-CM | POA: Diagnosis not present

## 2016-10-06 DIAGNOSIS — D2322 Other benign neoplasm of skin of left ear and external auricular canal: Secondary | ICD-10-CM | POA: Diagnosis not present

## 2016-10-06 DIAGNOSIS — Z719 Counseling, unspecified: Secondary | ICD-10-CM | POA: Diagnosis not present

## 2016-10-06 DIAGNOSIS — D2361 Other benign neoplasm of skin of right upper limb, including shoulder: Secondary | ICD-10-CM | POA: Diagnosis not present

## 2016-10-06 DIAGNOSIS — Z85828 Personal history of other malignant neoplasm of skin: Secondary | ICD-10-CM | POA: Diagnosis not present

## 2016-10-07 ENCOUNTER — Other Ambulatory Visit: Payer: Self-pay | Admitting: Internal Medicine

## 2016-10-15 ENCOUNTER — Encounter: Payer: Self-pay | Admitting: Internal Medicine

## 2016-10-15 ENCOUNTER — Other Ambulatory Visit (INDEPENDENT_AMBULATORY_CARE_PROVIDER_SITE_OTHER): Payer: Medicare Other

## 2016-10-15 ENCOUNTER — Ambulatory Visit (INDEPENDENT_AMBULATORY_CARE_PROVIDER_SITE_OTHER): Payer: Medicare Other | Admitting: Internal Medicine

## 2016-10-15 VITALS — BP 136/90 | HR 73 | Temp 98.3°F | Resp 16 | Ht 68.0 in | Wt 184.0 lb

## 2016-10-15 DIAGNOSIS — Z0000 Encounter for general adult medical examination without abnormal findings: Secondary | ICD-10-CM

## 2016-10-15 DIAGNOSIS — N400 Enlarged prostate without lower urinary tract symptoms: Secondary | ICD-10-CM | POA: Diagnosis not present

## 2016-10-15 DIAGNOSIS — I10 Essential (primary) hypertension: Secondary | ICD-10-CM

## 2016-10-15 DIAGNOSIS — Z23 Encounter for immunization: Secondary | ICD-10-CM

## 2016-10-15 DIAGNOSIS — K219 Gastro-esophageal reflux disease without esophagitis: Secondary | ICD-10-CM | POA: Diagnosis not present

## 2016-10-15 DIAGNOSIS — R7989 Other specified abnormal findings of blood chemistry: Secondary | ICD-10-CM | POA: Diagnosis not present

## 2016-10-15 DIAGNOSIS — E291 Testicular hypofunction: Secondary | ICD-10-CM

## 2016-10-15 LAB — LIPID PANEL
Cholesterol: 151 mg/dL (ref 0–200)
HDL: 40.2 mg/dL (ref >39.00)
NONHDL: 110.95
Total CHOL/HDL Ratio: 4
Triglycerides: 263 mg/dL — ABNORMAL HIGH (ref 0.0–149.0)
VLDL: 52.6 mg/dL — ABNORMAL HIGH (ref 0.0–40.0)

## 2016-10-15 LAB — COMPREHENSIVE METABOLIC PANEL
ALBUMIN: 4.2 g/dL (ref 3.5–5.2)
ALK PHOS: 73 U/L (ref 39–117)
ALT: 20 U/L (ref 0–53)
AST: 21 U/L (ref 0–37)
BUN: 20 mg/dL (ref 6–23)
CO2: 32 mEq/L (ref 19–32)
CREATININE: 1.13 mg/dL (ref 0.40–1.50)
Calcium: 9.2 mg/dL (ref 8.4–10.5)
Chloride: 104 mEq/L (ref 96–112)
GFR: 67.39 mL/min (ref >60.00)
GLUCOSE: 106 mg/dL — AB (ref 70–99)
Potassium: 3.7 mEq/L (ref 3.5–5.1)
SODIUM: 141 meq/L (ref 135–145)
TOTAL PROTEIN: 6.2 g/dL (ref 6.0–8.3)
Total Bilirubin: 0.6 mg/dL (ref 0.2–1.2)

## 2016-10-15 LAB — LDL CHOLESTEROL, DIRECT: Direct LDL: 83 mg/dL

## 2016-10-15 LAB — PSA: PSA: 1.68 ng/mL (ref 0.10–4.00)

## 2016-10-15 NOTE — Progress Notes (Signed)
   Subjective:    Patient ID: Antonio Ferrell, male    DOB: Apr 10, 1941, 75 y.o.   MRN: [id number]  HPI Here for medicare wellness. Please see A/P for status and treatment of chronic medical problems.   HPI #2: Here for follow up of medical conditions including his hypertension (taking hctz, no side effects, not complicated, no headaches or chest pains or abdominal pains) and his gerd (taking omeprazole over the counter as needed, rare usage, tries to be good about diet). No new concerns today.   Diet: heart healthy  Physical activity: active Depression/mood screen: negative Hearing: intact to whispered voice Visual acuity: grossly normal, performs annual eye exam  ADLs: capable Fall risk: none Home safety: good Cognitive evaluation: intact to orientation, naming, recall and repetition EOL planning: adv directives discussed  I have personally reviewed and have noted 1. The patient's medical and social history - reviewed today no changes 2. Their use of alcohol, tobacco or illicit drugs 3. Their current medications and supplements 4. The patient's functional ability including ADL's, fall risks, home safety risks and hearing or visual impairment. 5. Diet and physical activities 6. Evidence for depression or mood disorders 7. Care team reviewed and updated (available in snapshot)  Review of Systems  Constitutional: Negative.   HENT: Negative.   Eyes: Negative.   Respiratory: Negative for cough, chest tightness and shortness of breath.   Cardiovascular: Negative for chest pain, palpitations and leg swelling.  Gastrointestinal: Negative for abdominal distention, abdominal pain, constipation, diarrhea, nausea and vomiting.  Musculoskeletal: Negative.   Skin: Negative.   Neurological: Negative.   Psychiatric/Behavioral: Negative.       Objective:   Physical Exam  Constitutional: He is oriented to person, place, and time. He appears well-developed and well-nourished.  HENT:  Head:  Normocephalic and atraumatic.  Eyes: EOM are normal.  Neck: Normal range of motion.  Cardiovascular: Normal rate and regular rhythm.   Carotids without bruit  Pulmonary/Chest: Effort normal and breath sounds normal. No respiratory distress. He has no wheezes. He has no rales.  Abdominal: Soft. Bowel sounds are normal. He exhibits no distension. There is no tenderness. There is no rebound.  Musculoskeletal: He exhibits no edema.  Neurological: He is alert and oriented to person, place, and time. Coordination normal.  Skin: Skin is warm and dry.  Psychiatric: He has a normal mood and affect.   Vitals:   10/15/16 1441  BP: 136/90  Pulse: 73  Resp: 16  Temp: 98.3 F (36.8 C)  TempSrc: Oral  SpO2: 98%  Weight: 184 lb (83.5 kg)  Height: 5\' 8"  (1.727 m)   EKG: Rate 73, axis normal, intervals normal, RBBB, no st or t wave changes, when compared to prior in 2012 no significant changes.     Assessment & Plan:  Flu shot given at visit

## 2016-10-15 NOTE — Patient Instructions (Signed)
Your EKG is the same as 5 years ago.   We are checking the labs today and will send the results on mychart.   Health Maintenance, Male A healthy lifestyle and preventative care can promote health and wellness.  Maintain regular health, dental, and eye exams.  Eat a healthy diet. Foods like vegetables, fruits, whole grains, low-fat dairy products, and lean protein foods contain the nutrients you need and are low in calories. Decrease your intake of foods high in solid fats, added sugars, and salt. Get information about a proper diet from your health care provider, if necessary.  Regular physical exercise is one of the most important things you can do for your health. Most adults should get at least 150 minutes of moderate-intensity exercise (any activity that increases your heart rate and causes you to sweat) each week. In addition, most adults need muscle-strengthening exercises on 2 or more days a week.   Maintain a healthy weight. The body mass index (BMI) is a screening tool to identify possible weight problems. It provides an estimate of body fat based on height and weight. Your health care provider can find your BMI and can help you achieve or maintain a healthy weight. For males 20 years and older:  A BMI below 18.5 is considered underweight.  A BMI of 18.5 to 24.9 is normal.  A BMI of 25 to 29.9 is considered overweight.  A BMI of 30 and above is considered obese.  Maintain normal blood lipids and cholesterol by exercising and minimizing your intake of saturated fat. Eat a balanced diet with plenty of fruits and vegetables. Blood tests for lipids and cholesterol should begin at age 49 and be repeated every 5 years. If your lipid or cholesterol levels are high, you are over age 64, or you are at high risk for heart disease, you may need your cholesterol levels checked more frequently.Ongoing high lipid and cholesterol levels should be treated with medicines if diet and exercise are not  working.  If you smoke, find out from your health care provider how to quit. If you do not use tobacco, do not start.  Lung cancer screening is recommended for adults aged 68-80 years who are at high risk for developing lung cancer because of a history of smoking. A yearly low-dose CT scan of the lungs is recommended for people who have at least a 30-pack-year history of smoking and are current smokers or have quit within the past 15 years. A pack year of smoking is smoking an average of 1 pack of cigarettes a day for 1 year (for example, a 30-pack-year history of smoking could mean smoking 1 pack a day for 30 years or 2 packs a day for 15 years). Yearly screening should continue until the smoker has stopped smoking for at least 15 years. Yearly screening should be stopped for people who develop a health problem that would prevent them from having lung cancer treatment.  If you choose to drink alcohol, do not have more than 2 drinks per day. One drink is considered to be 12 oz (360 mL) of beer, 5 oz (150 mL) of wine, or 1.5 oz (45 mL) of liquor.  Avoid the use of street drugs. Do not share needles with anyone. Ask for help if you need support or instructions about stopping the use of drugs.  High blood pressure causes heart disease and increases the risk of stroke. High blood pressure is more likely to develop in:  People who have  blood pressure in the end of the normal range (100-139/85-89 mm Hg).  People who are overweight or obese.  People who are African American.  If you are 27-3 years of age, have your blood pressure checked every 3-5 years. If you are 74 years of age or older, have your blood pressure checked every year. You should have your blood pressure measured twice-once when you are at a hospital or clinic, and once when you are not at a hospital or clinic. Record the average of the two measurements. To check your blood pressure when you are not at a hospital or clinic, you can  use:  An automated blood pressure machine at a pharmacy.  A home blood pressure monitor.  If you are 24-74 years old, ask your health care provider if you should take aspirin to prevent heart disease.  Diabetes screening involves taking a blood sample to check your fasting blood sugar level. This should be done once every 3 years after age 15 if you are at a normal weight and without risk factors for diabetes. Testing should be considered at a younger age or be carried out more frequently if you are overweight and have at least 1 risk factor for diabetes.  Colorectal cancer can be detected and often prevented. Most routine colorectal cancer screening begins at the age of 49 and continues through age 66. However, your health care provider may recommend screening at an earlier age if you have risk factors for colon cancer. On a yearly basis, your health care provider may provide home test kits to check for hidden blood in the stool. A small camera at the end of a tube may be used to directly examine the colon (sigmoidoscopy or colonoscopy) to detect the earliest forms of colorectal cancer. Talk to your health care provider about this at age 30 when routine screening begins. A direct exam of the colon should be repeated every 5-10 years through age 63, unless early forms of precancerous polyps or small growths are found.  People who are at an increased risk for hepatitis B should be screened for this virus. You are considered at high risk for hepatitis B if:  You were born in a country where hepatitis B occurs often. Talk with your health care provider about which countries are considered high risk.  Your parents were born in a high-risk country and you have not received a shot to protect against hepatitis B (hepatitis B vaccine).  You have HIV or AIDS.  You use needles to inject street drugs.  You live with, or have sex with, someone who has hepatitis B.  You are a man who has sex with other  men (MSM).  You get hemodialysis treatment.  You take certain medicines for conditions like cancer, organ transplantation, and autoimmune conditions.  Hepatitis C blood testing is recommended for all people born from [birth year] through 1965 and any individual with known risk factors for hepatitis C.  Healthy men should no longer receive prostate-specific antigen (PSA) blood tests as part of routine cancer screening. Talk to your health care provider about prostate cancer screening.  Testicular cancer screening is not recommended for adolescents or adult males who have no symptoms. Screening includes self-exam, a health care provider exam, and other screening tests. Consult with your health care provider about any symptoms you have or any concerns you have about testicular cancer.  Practice safe sex. Use condoms and avoid high-risk sexual practices to reduce the spread of sexually transmitted infections (  STIs).  You should be screened for STIs, including gonorrhea and chlamydia if:  You are sexually active and are younger than 24 years.  You are older than 24 years, and your health care provider tells you that you are at risk for this type of infection.  Your sexual activity has changed since you were last screened, and you are at an increased risk for chlamydia or gonorrhea. Ask your health care provider if you are at risk.  If you are at risk of being infected with HIV, it is recommended that you take a prescription medicine daily to prevent HIV infection. This is called pre-exposure prophylaxis (PrEP). You are considered at risk if:  You are a man who has sex with other men (MSM).  You are a heterosexual man who is sexually active with multiple partners.  You take drugs by injection.  You are sexually active with a partner who has HIV.  Talk with your health care provider about whether you are at high risk of being infected with HIV. If you choose to begin PrEP, you should first be tested  for HIV. You should then be tested every 3 months for as long as you are taking PrEP.  Use sunscreen. Apply sunscreen liberally and repeatedly throughout the day. You should seek shade when your shadow is shorter than you. Protect yourself by wearing long sleeves, pants, a wide-brimmed hat, and sunglasses year round whenever you are outdoors.  Tell your health care provider of new moles or changes in moles, especially if there is a change in shape or color. Also, tell your health care provider if a mole is larger than the size of a pencil eraser.  A one-time screening for abdominal aortic aneurysm (AAA) and surgical repair of large AAAs by ultrasound is recommended for men aged 1-75 years who are current or former smokers.  Stay current with your vaccines (immunizations). This information is not intended to replace advice given to you by your health care provider. Make sure you discuss any questions you have with your health care provider. Document Released: 04/16/2008 Document Revised: 11/09/2014 Document Reviewed: 07/23/2015 Elsevier Interactive Patient Education  2017 [employer].

## 2016-10-15 NOTE — Assessment & Plan Note (Signed)
Colonoscopy due in 2023, shingles completed, flu shot done today. Still needs pnevnar 13. Tetanus up to date. Counseled on sun safety and mole surveillance. Counseled about the dangers of distracted driving. Given 10 year screening recommendations.

## 2016-10-15 NOTE — Assessment & Plan Note (Signed)
Previous dilation and uses omeprazole otc for GERD and to avoid stenosis again.

## 2016-10-15 NOTE — Assessment & Plan Note (Signed)
BP at goal on HCTZ 25 mg daily, checking CMP and adjust as needed. No side effects. EKG without change from prior.

## 2016-10-15 NOTE — Progress Notes (Signed)
Pre visit review using our clinic review tool, if applicable. No additional management support is needed unless otherwise documented below in the visit note. 

## 2016-10-16 LAB — CBC
HCT: 46.7 % (ref 39.0–52.0)
Hemoglobin: 16 g/dL (ref 13.0–17.0)
MCHC: 34.4 g/dL (ref 30.0–36.0)
MCV: 88.2 fl (ref 78.0–100.0)
Platelets: 223 10*3/uL (ref 150.0–400.0)
RBC: 5.3 Mil/uL (ref 4.22–5.81)
RDW: 13.3 % (ref 11.5–15.5)
WBC: 8 10*3/uL (ref 4.0–10.5)

## 2016-10-17 ENCOUNTER — Encounter: Payer: Self-pay | Admitting: Internal Medicine

## 2016-11-04 DIAGNOSIS — M19011 Primary osteoarthritis, right shoulder: Secondary | ICD-10-CM | POA: Diagnosis not present

## 2016-11-09 DIAGNOSIS — D230 Other benign neoplasm of skin of lip: Secondary | ICD-10-CM | POA: Diagnosis not present

## 2016-11-09 DIAGNOSIS — D2339 Other benign neoplasm of skin of other parts of face: Secondary | ICD-10-CM | POA: Diagnosis not present

## 2016-11-09 DIAGNOSIS — Z872 Personal history of diseases of the skin and subcutaneous tissue: Secondary | ICD-10-CM | POA: Diagnosis not present

## 2016-11-09 DIAGNOSIS — D234 Other benign neoplasm of skin of scalp and neck: Secondary | ICD-10-CM | POA: Diagnosis not present

## 2016-11-09 DIAGNOSIS — D2371 Other benign neoplasm of skin of right lower limb, including hip: Secondary | ICD-10-CM | POA: Diagnosis not present

## 2016-11-09 DIAGNOSIS — Z85828 Personal history of other malignant neoplasm of skin: Secondary | ICD-10-CM | POA: Diagnosis not present

## 2016-11-09 DIAGNOSIS — L820 Inflamed seborrheic keratosis: Secondary | ICD-10-CM | POA: Diagnosis not present

## 2017-01-05 DIAGNOSIS — D230 Other benign neoplasm of skin of lip: Secondary | ICD-10-CM | POA: Diagnosis not present

## 2017-01-05 DIAGNOSIS — D2321 Other benign neoplasm of skin of right ear and external auricular canal: Secondary | ICD-10-CM | POA: Diagnosis not present

## 2017-01-05 DIAGNOSIS — D2362 Other benign neoplasm of skin of left upper limb, including shoulder: Secondary | ICD-10-CM | POA: Diagnosis not present

## 2017-01-05 DIAGNOSIS — D2339 Other benign neoplasm of skin of other parts of face: Secondary | ICD-10-CM | POA: Diagnosis not present

## 2017-01-05 DIAGNOSIS — Z85828 Personal history of other malignant neoplasm of skin: Secondary | ICD-10-CM | POA: Diagnosis not present

## 2017-01-05 DIAGNOSIS — D2322 Other benign neoplasm of skin of left ear and external auricular canal: Secondary | ICD-10-CM | POA: Diagnosis not present

## 2017-01-05 DIAGNOSIS — D2372 Other benign neoplasm of skin of left lower limb, including hip: Secondary | ICD-10-CM | POA: Diagnosis not present

## 2017-01-05 DIAGNOSIS — Z872 Personal history of diseases of the skin and subcutaneous tissue: Secondary | ICD-10-CM | POA: Diagnosis not present

## 2017-01-05 DIAGNOSIS — D2371 Other benign neoplasm of skin of right lower limb, including hip: Secondary | ICD-10-CM | POA: Diagnosis not present

## 2017-01-05 DIAGNOSIS — D2361 Other benign neoplasm of skin of right upper limb, including shoulder: Secondary | ICD-10-CM | POA: Diagnosis not present

## 2017-01-05 DIAGNOSIS — D234 Other benign neoplasm of skin of scalp and neck: Secondary | ICD-10-CM | POA: Diagnosis not present

## 2017-01-05 DIAGNOSIS — L820 Inflamed seborrheic keratosis: Secondary | ICD-10-CM | POA: Diagnosis not present

## 2017-01-26 DIAGNOSIS — R05 Cough: Secondary | ICD-10-CM | POA: Diagnosis not present

## 2017-01-26 DIAGNOSIS — K210 Gastro-esophageal reflux disease with esophagitis: Secondary | ICD-10-CM | POA: Diagnosis not present

## 2017-01-26 DIAGNOSIS — J410 Simple chronic bronchitis: Secondary | ICD-10-CM | POA: Diagnosis not present

## 2017-01-26 DIAGNOSIS — J45991 Cough variant asthma: Secondary | ICD-10-CM | POA: Diagnosis not present

## 2017-01-26 DIAGNOSIS — J301 Allergic rhinitis due to pollen: Secondary | ICD-10-CM | POA: Diagnosis not present

## 2017-02-03 DIAGNOSIS — Z23 Encounter for immunization: Secondary | ICD-10-CM | POA: Diagnosis not present

## 2017-02-05 ENCOUNTER — Ambulatory Visit: Payer: Medicare Other | Admitting: Cardiology

## 2017-02-09 ENCOUNTER — Ambulatory Visit (INDEPENDENT_AMBULATORY_CARE_PROVIDER_SITE_OTHER): Payer: Medicare Other | Admitting: Cardiology

## 2017-02-09 ENCOUNTER — Encounter: Payer: Self-pay | Admitting: Cardiology

## 2017-02-09 VITALS — BP 146/82 | HR 74 | Ht 67.5 in | Wt 182.0 lb

## 2017-02-09 DIAGNOSIS — R9431 Abnormal electrocardiogram [ECG] [EKG]: Secondary | ICD-10-CM | POA: Diagnosis not present

## 2017-02-09 DIAGNOSIS — I10 Essential (primary) hypertension: Secondary | ICD-10-CM | POA: Diagnosis not present

## 2017-02-09 DIAGNOSIS — R002 Palpitations: Secondary | ICD-10-CM

## 2017-02-09 NOTE — Progress Notes (Signed)
PCP: Hoyt Koch, MD  Clinic Note: Chief Complaint  Patient presents with  . New Patient (Initial Visit)    Irregular heartbeat    HPI: Antonio Ferrell is a 76 y.o. male with a PMH below who presents today for cardiology evaluation for irregular heartbeat/palpitations. He has poorly controlled diabetes as well as hypertension and lipidemia.  Recent Hospitalizations: n/a  Studies Reviewed: none  Interval History: Antonio Ferrell presents today basically because ever since he was told that he had an irregular heartbeat on exam, he was noticing that he does feel his heart skipped beats. He really had not noticed it before, but now that he knows what it is he does say that he has had that. Doesn't really bother him, he just was acknowledging that he understood what exam finding was. He really only notes the abnormal beats when he's feeling his pulse, he wouldn't otherwise feeling in his chest. He still does all kinds of exercise and activity. He goes to gym and works out and does all kinds of chores around the house. He is very active and exercises quite a bit without any significant symptoms. He has not had any chest tightness or pressure with rest or exertion. No exertional dyspnea. No PND, orthopnea or edema. No syncope or near-syncope. TIA status post fugax. He doesn't really tell that these skipped beats happen more with activity than at rest. In fact he thinks is the opposite. He usually notes it a few minutes after doing activity.   No claudication  ROS: A comprehensive was performed. Review of Systems  Constitutional: Negative for malaise/fatigue.  HENT: Negative for nosebleeds.   Respiratory: Negative for cough, shortness of breath and wheezing.   Cardiovascular: Positive for palpitations.       Otherwise negative per history of present illness  Gastrointestinal: Negative for blood in stool, heartburn and melena.  Genitourinary: Negative for hematuria.  Musculoskeletal:  Positive for joint pain (Does not limit activity). Negative for myalgias.  Neurological: Negative for dizziness and focal weakness.  Endo/Heme/Allergies: Negative for environmental allergies. Does not bruise/bleed easily.  Psychiatric/Behavioral: Negative for depression and memory loss. The patient is not nervous/anxious and does not have insomnia.   All other systems reviewed and are negative.  Past Medical History:  Diagnosis Date  . GERD (gastroesophageal reflux disease)   . High blood pressure   . Nephrolithiasis   . Other dysphagia     Past Surgical History:  Procedure Laterality Date  . HERNIA REPAIR     right groin  . TONSILLECTOMY      Current Meds  Medication Sig  . aspirin 81 MG tablet Take 81 mg by mouth daily.    . hydrochlorothiazide (HYDRODIURIL) 25 MG tablet TAKE 1 TABLET BY MOUTH EVERY DAY  . ibuprofen (ADVIL,MOTRIN) 200 MG tablet Take 400 mg by mouth every 6 (six) hours as needed.  Marland Kitchen LEVITRA 10 MG tablet TAKE AS DIRECTED  . omeprazole (PRILOSEC) 10 MG capsule Take 10 mg by mouth daily.    No Known Allergies  Social History   Social History  . Marital status: Married    Spouse name: N/A  . Number of children: 0  . Years of education: N/A   Occupational History  . retired    Social History Main Topics  . Smoking status: Never Smoker  . Smokeless tobacco: Never Used  . Alcohol use No     Comment: rare use  . Drug use: No  . Sexual activity: Not Asked  Other Topics Concern  . 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 No children   Writer. Refurbished and restored a hypercub    family history includes Alcohol abuse in his father; Atrial fibrillation in his sister; Cancer in his maternal grandfather;  Colon cancer in his paternal grandfather; Coronary artery disease in his sister; Heart attack in his paternal grandmother; Heart attack (age of onset: 63) in his father; Heart disease in his father; Heart failure in his maternal grandmother; Lung cancer in his mother; Stroke in his father.  Wt Readings from Last 3 Encounters:  02/09/17 182 lb (82.6 kg)  10/15/16 184 lb (83.5 kg)  06/15/16 183 lb 12.8 oz (83.4 kg)    PHYSICAL EXAM BP (!) 146/82   Pulse 74   Ht 5' 7.5" (1.715 m)   Wt 182 lb (82.6 kg)   BMI 28.08 kg/m  General appearance: alert, cooperative, appears stated age, no distress and Healthy-appearing. Well-nourished well-groomed.  HEENT: [place]/AT, EOMI, MMM, anicteric sclera Neck: no adenopathy, no carotid bruit and no JVD Lungs: clear to auscultation bilaterally, normal percussion bilaterally and non-labored Heart: regular rate and rhythm - with frequent ectopy, S1 and S2 normal, no murmur, click, rub or gallop ; nondisplaced PMI Abdomen: soft, non-tender; bowel sounds normal; no masses,  no organomegaly; no HJR Extremities: extremities normal, atraumatic, no cyanosis, and edema - none Pulses: 2+ and symmetric;  Skin: mobility and turgor normal, no edema, no evidence of bleeding or bruising and no lesions noted  Neurologic/ psych: Mental status: Alert, oriented, thought content appropriate. Normal mood and affect. 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  Narrative Interpretation: Abnormal EKG, but appears to be stable when compared to prior EKGs.  Other studies Reviewed: Additional studies/ records that were reviewed today include:  Recent Labs:     Spirometry reviewed. Full report was not present, specifically tracings. Within normal limits.   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      Relevant Orders   EKG 12-Lead (Completed)   ECHOCARDIOGRAM COMPLETE   EXERCISE TOLERANCE TEST (Completed)   Holter monitor - 24 hour   Hypertension    Upper limit of goal blood pressure on HCTZ. For now, given lack of clarity of his symptoms, I will be brought reluctant to treat his PVCs/PACs with a beta blocker for fear of making his symptoms worse.      Palpitations - Primary    On exam, these sounds are probably more consistent with PVCs and PACs, however cannot be sure. The fact the EKG shows ectopy is reassuring this is probably something that is happening more than he knows. He had a lot on exam that did not elicit any response from him.  I suspect that he probably is having PVCs and PACs. The question is how much burden, and  do they appear to get worse with activity. Plan: 24-hour Holter monitor to monitor for burden. GXT/ETT (exercise tolerance test). This would be to evaluate for any potential ischemic etiology given the fact he does have irregular EKG. But also to see if the ectopy occurs more with rest and exertion. Check 2-D echocardiogram to evaluate for structural abnormality.      Relevant Orders   EKG 12-Lead (Completed)   ECHOCARDIOGRAM COMPLETE   EXERCISE TOLERANCE TEST (Completed)   Holter monitor - 24 hour      Current medicines are reviewed at length with the patient today. (+/- concerns) None The following changes have been made: None  Patient Instructions  SCHEDULE AT [facility] has recommended that you wear a holter monitor- 24 HOUR. Holter monitors are medical devices that record the heart's electrical activity. Doctors most often use these monitors to diagnose  arrhythmias. Arrhythmias are problems with the speed or rhythm of the heartbeat. The monitor is a small, portable device. You can wear one while you do your normal daily activities. This is usually used to diagnose what is causing palpitations/syncope (passing out).  Your physician has requested that you have an echocardiogram. Echocardiography is a painless test that uses sound waves to create images of your heart. It provides your doctor with information about the size and shape of your heart and how well your heart's chambers and valves are working. This procedure takes approximately one hour. There are no restrictions for this procedure.   SCHEDULE [facility] has requested that you have an exercise tolerance test. For further information please visit [email or web address]. Please also follow instruction sheet, as given.    Your physician recommends that you schedule a follow-up appointment in [place].    Studies Ordered:   Orders Placed This Encounter  Procedures  . EXERCISE TOLERANCE TEST  . Holter monitor - 24 hour  . EKG 12-Lead  . ECHOCARDIOGRAM COMPLETE      Glenetta Hew, M.D., M.S. Interventional Cardiologist   Pager # [phone] Phone # [phone] [address] [place], [place] [address]

## 2017-02-09 NOTE — Patient Instructions (Signed)
SCHEDULE AT [name] has recommended that you wear a holter monitor- 24 HOUR. Holter monitors are medical devices that record the heart's electrical activity. Doctors most often use these monitors to diagnose arrhythmias. Arrhythmias are problems with the speed or rhythm of the heartbeat. The monitor is a small, portable device. You can wear one while you do your normal daily activities. This is usually used to diagnose what is causing palpitations/syncope (passing out).  Your physician has requested that you have an echocardiogram. Echocardiography is a painless test that uses sound waves to create images of your heart. It provides your doctor with information about the size and shape of your heart and how well your heart's chambers and valves are working. This procedure takes approximately one hour. There are no restrictions for this procedure.   SCHEDULE Antonio Ferrell has requested that you have an exercise tolerance test. For further information please visit [email or web address]. Please also follow instruction sheet, as given.    Your physician recommends that you schedule a follow-up appointment in [name].

## 2017-02-10 ENCOUNTER — Telehealth (HOSPITAL_COMMUNITY): Payer: Self-pay

## 2017-02-10 DIAGNOSIS — M19011 Primary osteoarthritis, right shoulder: Secondary | ICD-10-CM | POA: Diagnosis not present

## 2017-02-10 NOTE — Telephone Encounter (Signed)
Encounter complete. 

## 2017-02-11 ENCOUNTER — Telehealth (HOSPITAL_COMMUNITY): Payer: Self-pay

## 2017-02-11 ENCOUNTER — Encounter: Payer: Self-pay | Admitting: Cardiology

## 2017-02-11 ENCOUNTER — Ambulatory Visit (HOSPITAL_COMMUNITY)
Admission: RE | Admit: 2017-02-11 | Discharge: 2017-02-11 | Disposition: A | Discharge status: Home / Self Care | Payer: Medicare Other | Source: Ambulatory Visit | Attending: Cardiovascular Disease | Admitting: Cardiovascular Disease

## 2017-02-11 DIAGNOSIS — R9439 Abnormal result of other cardiovascular function study: Secondary | ICD-10-CM | POA: Diagnosis not present

## 2017-02-11 DIAGNOSIS — R002 Palpitations: Secondary | ICD-10-CM | POA: Diagnosis not present

## 2017-02-11 DIAGNOSIS — I4519 Other right bundle-branch block: Secondary | ICD-10-CM | POA: Diagnosis not present

## 2017-02-11 DIAGNOSIS — I493 Ventricular premature depolarization: Secondary | ICD-10-CM | POA: Diagnosis not present

## 2017-02-11 DIAGNOSIS — R9431 Abnormal electrocardiogram [ECG] [EKG]: Secondary | ICD-10-CM | POA: Insufficient documentation

## 2017-02-11 DIAGNOSIS — I10 Essential (primary) hypertension: Secondary | ICD-10-CM | POA: Insufficient documentation

## 2017-02-11 HISTORY — PX: OTHER SURGICAL HISTORY: SHX169

## 2017-02-11 LAB — EXERCISE TOLERANCE TEST
CHL RATE OF PERCEIVED EXERTION: 18
CSEPED: 10 min
CSEPEW: 12.9 METS
CSEPHR: 110 %
CSEPPHR: 162 {beats}/min
Exercise duration (sec): 46 s
MPHR: 146 {beats}/min
Rest HR: 93 {beats}/min

## 2017-02-11 NOTE — Assessment & Plan Note (Signed)
On exam, these sounds are probably more consistent with PVCs and PACs, however cannot be sure. The fact the EKG shows ectopy is reassuring this is probably something that is happening more than he knows. He had a lot on exam that did not elicit any response from him.  I suspect that he probably is having PVCs and PACs. The question is how much burden, and do they appear to get worse with activity. Plan: 24-hour Holter monitor to monitor for burden. GXT/ETT (exercise tolerance test). This would be to evaluate for any potential ischemic etiology given the fact he does have irregular EKG. But also to see if the ectopy occurs more with rest and exertion. Check 2-D echocardiogram to evaluate for structural abnormality.

## 2017-02-11 NOTE — Assessment & Plan Note (Signed)
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

## 2017-02-11 NOTE — Telephone Encounter (Signed)
Encounter complete. 

## 2017-02-11 NOTE — Assessment & Plan Note (Signed)
Upper limit of goal blood pressure on HCTZ. For now, given lack of clarity of his symptoms, I will be brought reluctant to treat his PVCs/PACs with a beta blocker for fear of making his symptoms worse.

## 2017-02-24 ENCOUNTER — Ambulatory Visit (HOSPITAL_COMMUNITY): Payer: Medicare Other | Attending: Cardiology

## 2017-02-24 ENCOUNTER — Other Ambulatory Visit: Payer: Self-pay

## 2017-02-24 ENCOUNTER — Ambulatory Visit (INDEPENDENT_AMBULATORY_CARE_PROVIDER_SITE_OTHER): Payer: Medicare Other

## 2017-02-24 DIAGNOSIS — I119 Hypertensive heart disease without heart failure: Secondary | ICD-10-CM | POA: Insufficient documentation

## 2017-02-24 DIAGNOSIS — I340 Nonrheumatic mitral (valve) insufficiency: Secondary | ICD-10-CM | POA: Insufficient documentation

## 2017-02-24 DIAGNOSIS — R002 Palpitations: Secondary | ICD-10-CM

## 2017-02-24 DIAGNOSIS — R9431 Abnormal electrocardiogram [ECG] [EKG]: Secondary | ICD-10-CM

## 2017-02-24 HISTORY — PX: OTHER SURGICAL HISTORY: SHX169

## 2017-02-24 HISTORY — PX: TRANSTHORACIC ECHOCARDIOGRAM: SHX275

## 2017-02-26 NOTE — Progress Notes (Signed)
Echo results: Good news: Essentially normal echocardiogram and normal pump function and normal valve function.  EF (ejection fraction): 55-60%. Normal range is 55-65%. No regional wall motion abnormalities  = No signs to suggest heart attack.Glenetta Hew, MD

## 2017-03-04 ENCOUNTER — Ambulatory Visit (INDEPENDENT_AMBULATORY_CARE_PROVIDER_SITE_OTHER): Payer: Medicare Other | Admitting: Cardiology

## 2017-03-04 ENCOUNTER — Encounter: Payer: Self-pay | Admitting: Cardiology

## 2017-03-04 DIAGNOSIS — I493 Ventricular premature depolarization: Secondary | ICD-10-CM | POA: Diagnosis not present

## 2017-03-04 DIAGNOSIS — I10 Essential (primary) hypertension: Secondary | ICD-10-CM

## 2017-03-04 DIAGNOSIS — R9431 Abnormal electrocardiogram [ECG] [EKG]: Secondary | ICD-10-CM

## 2017-03-04 NOTE — Progress Notes (Signed)
PCP: Hoyt Koch, MD  Clinic Note: Chief Complaint  Patient presents with  . Follow-up    Pt. states no complaints  . Palpitations    asymptomatic PVCs/PACs    HPI: Antonio Ferrell is a 76 y.o. male with a PMH below who presents today for Follow-up after initial evaluation for PVCs noted on EKG  Antonio Ferrell was first seen on 02/09/2017 at the request of Hoyt Koch, *. He noted that he was able to feel his irregular pulse (but had never noted it until his PCP asked him about it).   He really only notes the abnormal beats when he's feeling his pulse, he wouldn't otherwise feel anything unusual in his chest. He still does all kinds of exercise and activity. He goes to gym and works out and does all kinds of chores around the house. He is very active and exercises quite a bit without any significant symptoms.  He has not had any chest tightness or pressure with rest or exertion - He was evaluated with an Echocardiogram, 24hr Holter monitor & a GXT.  Studies Personally Reviewed - if available, images/films reviewed: From Epic Chart or Care EveryWhere:  24 Holter monitor (02/24/2017) :  No worrisome arrhythmias. No symptoms noted.  Mostly NSR with some sinus bradycardia & tachycardia: Min HR 47 bpm; Max HR 122 bpm  Monomorphic PVCs in singles, coupletes, trigeminy and bigeminy pattern - longest run of bigeminy ~14 beats.  Less frequent PACs with a shor run of PAT - longest run ~11 beats, 155 bpm; Some PACs are aberrantly conducted   Echocardiogram 02/24/2017: Normal LV size and function. EF 55-60%. Normal wall motion. GR 1 DD. Mild MR. Otherwise normal  GXT 02/11/2017: Bruce protocol 10:46 min, 12.9 METS. Excellent exercise tolerance. Peak HR 162 (110% Max Predicted). Hypertensive response - 159/100 mmHg --> 202/105 mmHg. PVCs noted with stress - but no increase in PVCs. No CP noted & no ischemic ST segment changes.  Interval History: Antonio Ferrell presents today doing  quite well. He is here to hear the results of the studies and was happy to hear that the echocardiogram and GXT were normal. He didn't quite understand the event monitor. I explained to him with findings are and that I was not surprised about the PACs and PVCs noted. He was not symptomatically at all. He continues to be asymptomatic with no sensation of rapid irregular heartbeats or palpitations.  No lightheadedness, dizziness or syncope/near CT be to suggest arrhythmias. He remains as active as ever and was only a little bit confused about the hypertension aspect because his blood pressures abuse been well controlled.   Essentially Cardiac Review of Symptoms as follows: No chest pain or shortness of breath with rest or exertion. No PND, orthopnea or edema.  No palpitations, lightheadedness, dizziness, weakness or syncope/near syncope. No TIA/amaurosis fugax symptoms.  No claudication.  ROS: A comprehensive was performed. Review of Systems  Constitutional: Negative for malaise/fatigue.  All other systems reviewed and are negative.   I have reviewed and (if needed) personally updated the patient's problem list, medications, allergies, past medical and surgical history, social and family history.   Past Medical History:  Diagnosis Date  . GERD (gastroesophageal reflux disease)   . High blood pressure   . Nephrolithiasis   . Other dysphagia     Past Surgical History:  Procedure Laterality Date  . 24 hr Holter Monitor  02/24/2017   No worrisome arrhythmias. No symptoms noted.  Mostly NSR with  some sinus bradycardia & tachycardia: Min HR 47 bpm; Max HR 122 bpm.  Monomorphic PVCs in singles, coupletes, trigeminy and bigeminy pattern - longest run of bigeminy ~14 beats.  Less frequent PACs with a shor run of PAT - longest run ~11 beats, 155 bpm; Some PACs are aberrantly conducted  . Exercise Tolerance Test  02/11/2017   LOW RISK:  Bruce protocol 10:46 min, 12.9 METS. Excellent exercise  tolerance. Peak HR 162 (110% Max Predicted). Hypertensive response - 159/100 mmHg --> 202/105 mmHg. PVCs noted with stress - but no increase in PVCs. No CP noted & no ischemic ST segment changes.  Marland Kitchen HERNIA REPAIR     right groin  . TONSILLECTOMY    . TRANSTHORACIC ECHOCARDIOGRAM  02/24/2017   Normal LV size and function. EF 55-60%. Normal wall motion. GR 1 DD. Mild MR. Otherwise normal    Current Meds  Medication Sig  . aspirin 81 MG tablet Take 81 mg by mouth daily.    . hydrochlorothiazide (HYDRODIURIL) 25 MG tablet TAKE 1 TABLET BY MOUTH EVERY DAY  . ibuprofen (ADVIL,MOTRIN) 200 MG tablet Take 400 mg by mouth every 6 (six) hours as needed.  Marland Kitchen LEVITRA 10 MG tablet TAKE AS DIRECTED  . omeprazole (PRILOSEC) 10 MG capsule Take 10 mg by mouth daily.    No Known Allergies  Social History   Social History  . Marital status: Married    Spouse name: N/A  . Number of children: 0  . Years of education: N/A   Occupational History  . retired    Social History Main Topics  . Smoking status: Never Smoker  . Smokeless tobacco: Never Used  . Alcohol use No     Comment: rare use  . Drug use: No  . Sexual activity: Not Asked   Other Topics Concern  . 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 No children   Writer. Refurbished and restored a hypercub    family history includes Alcohol abuse in his father; Atrial fibrillation in his sister; Cancer in his maternal grandfather; Colon cancer in his paternal grandfather; Coronary artery disease in his sister; Heart attack in his paternal grandmother; Heart attack (age of onset: 55) in his father; Heart disease in his father; Heart failure in his maternal grandmother; Lung cancer in his mother; Stroke  in his father.  Wt Readings from Last 3 Encounters:  03/04/17 182 lb (82.6 kg)  02/09/17 182 lb (82.6 kg)  10/15/16 184 lb (83.5 kg)    PHYSICAL EXAM BP 118/80   Pulse 74   Ht 5' 7.5" (1.715 m)   Wt 182 lb (82.6 kg)   BMI 28.08 kg/m  General appearance: alert, cooperative, appears stated age, no distress and Well-nourished, well-groomed. Healthy-appearing. Pleasant mood and affect. Lungs: clear to auscultation bilaterally, normal percussion bilaterally and non-labored Heart: regular rate and rhythm, S1&S2 normal, no murmur, click, rub or gallop; nondisplaced PMI. Occasional ectopy noted. Abdomen: soft, non-tender; bowel sounds normal; no masses,  no organomegaly;  Extremities: extremities normal, atraumatic, no cyanosis, or edema Pulses: 2+ and symmetric;  Neurologic: Mental status: Alert, oriented, thought content appropriate   Adult ECG Report n/a  Other studies Reviewed: Additional studies/ records that were reviewed today include:  Recent  Labs:  n/a     ASSESSMENT / PLAN: Problem List Items Addressed This Visit    Abnormal finding on EKG    No structural abnormalities noted on EKG given the right bundle branch block and continue . Lactate as well as did on the treadmill gives a very good negative predictive value for overall cardiovascular risk. Continue to stay active with exercise. I think he may just have more PACs and PVCs because his resting heart rate is somewhat lower. As long as he is not having symptoms, would not evaluate further.      Asymptomatic PVCs    Interesting that his monitor showed PACs and PVCs with some bigeminy and trigeminy. I was expecting mostly PVCs. He has a normal echocardiogram and relatively normal GXT. No symptoms. At this point I think he is not having symptoms and I would not try to treat asymptomatic PACs and PVCs just because they're there. He may have been hypertensive during his stress test, but he is not hypertensive now. I think the  side effects of the beta blocker or calcium channel blocker would probably be worse than the benefit.      Hypertension (Chronic)    Interesting that his blood pressure looks pretty good here today. On his treadmill stress test his blood pressures were very high. He therefore clearly has yet ability to get hypertensive but we need to be really careful with treating hypertension as he currently is in the very normal range. I would prefer to be less aggressive in the absence of symptoms.         Current medicines are reviewed at length with the patient today. (+/- concerns) n/a The following changes have been made: n/a  Patient Instructions  NO CHANGE WITH CURRENT TREATMENT   KEEP HYDRATE AVOID SWEETS AND CAFFEINE - [facility]   Your physician wants you to follow-up in [place]. You will receive a reminder letter in the mail two months in advance. If you don't receive a letter, please call our office to schedule the follow-up appointment.   If you need a refill on your cardiac medications before your next appointment, please call your pharmacy.     Studies Ordered:   No orders of the defined types were placed in this encounter.     Glenetta Hew, M.D., M.S. Interventional Cardiologist   Pager # [phone] Phone # [phone] [address] [place], [place] [address]

## 2017-03-04 NOTE — Patient Instructions (Signed)
NO CHANGE WITH CURRENT TREATMENT   KEEP HYDRATE AVOID SWEETS AND CAFFEINE - MAKES PALPATIONS WORSE   Your physician wants you to follow-up in [place]. You will receive a reminder letter in the mail two months in advance. If you don't receive a letter, please call our office to schedule the follow-up appointment.   If you need a refill on your cardiac medications before your next appointment, please call your pharmacy.

## 2017-03-06 ENCOUNTER — Encounter: Payer: Self-pay | Admitting: Cardiology

## 2017-03-06 NOTE — Assessment & Plan Note (Signed)
Interesting that his blood pressure looks pretty good here today. On his treadmill stress test his blood pressures were very high. He therefore clearly has yet ability to get hypertensive but we need to be really careful with treating hypertension as he currently is in the very normal range. I would prefer to be less aggressive in the absence of symptoms.

## 2017-03-06 NOTE — Assessment & Plan Note (Signed)
Interesting that his monitor showed PACs and PVCs with some bigeminy and trigeminy. I was expecting mostly PVCs. He has a normal echocardiogram and relatively normal GXT. No symptoms. At this point I think he is not having symptoms and I would not try to treat asymptomatic PACs and PVCs just because they're there. He may have been hypertensive during his stress test, but he is not hypertensive now. I think the side effects of the beta blocker or calcium channel blocker would probably be worse than the benefit.

## 2017-03-06 NOTE — Assessment & Plan Note (Signed)
No structural abnormalities noted on EKG given the right bundle branch block and continue . Lactate as well as did on the treadmill gives a very good negative predictive value for overall cardiovascular risk. Continue to stay active with exercise. I think he may just have more PACs and PVCs because his resting heart rate is somewhat lower. As long as he is not having symptoms, would not evaluate further.

## 2017-04-07 DIAGNOSIS — Z23 Encounter for immunization: Secondary | ICD-10-CM | POA: Diagnosis not present

## 2017-04-14 DIAGNOSIS — M79601 Pain in right arm: Secondary | ICD-10-CM | POA: Diagnosis not present

## 2017-04-14 DIAGNOSIS — M25511 Pain in right shoulder: Secondary | ICD-10-CM | POA: Diagnosis not present

## 2017-04-19 DIAGNOSIS — M25511 Pain in right shoulder: Secondary | ICD-10-CM | POA: Diagnosis not present

## 2017-04-23 DIAGNOSIS — M25511 Pain in right shoulder: Secondary | ICD-10-CM | POA: Diagnosis not present

## 2017-05-04 DIAGNOSIS — M25511 Pain in right shoulder: Secondary | ICD-10-CM | POA: Diagnosis not present

## 2017-05-07 DIAGNOSIS — M25511 Pain in right shoulder: Secondary | ICD-10-CM | POA: Diagnosis not present

## 2017-05-11 DIAGNOSIS — M25511 Pain in right shoulder: Secondary | ICD-10-CM | POA: Diagnosis not present

## 2017-05-25 DIAGNOSIS — M25511 Pain in right shoulder: Secondary | ICD-10-CM | POA: Diagnosis not present

## 2017-05-27 DIAGNOSIS — M25511 Pain in right shoulder: Secondary | ICD-10-CM | POA: Diagnosis not present

## 2017-06-01 DIAGNOSIS — M25511 Pain in right shoulder: Secondary | ICD-10-CM | POA: Diagnosis not present

## 2017-06-04 DIAGNOSIS — M79601 Pain in right arm: Secondary | ICD-10-CM | POA: Diagnosis not present

## 2017-06-04 DIAGNOSIS — M25511 Pain in right shoulder: Secondary | ICD-10-CM | POA: Diagnosis not present

## 2017-06-15 ENCOUNTER — Ambulatory Visit: Payer: Medicare Other | Admitting: Endocrinology

## 2017-08-26 DIAGNOSIS — H10533 Contact blepharoconjunctivitis, bilateral: Secondary | ICD-10-CM | POA: Diagnosis not present

## 2017-09-07 DIAGNOSIS — Z23 Encounter for immunization: Secondary | ICD-10-CM | POA: Diagnosis not present

## 2017-09-28 ENCOUNTER — Other Ambulatory Visit: Payer: Self-pay | Admitting: Internal Medicine

## 2017-10-04 ENCOUNTER — Encounter: Payer: Self-pay | Admitting: Cardiology

## 2017-10-04 ENCOUNTER — Ambulatory Visit (INDEPENDENT_AMBULATORY_CARE_PROVIDER_SITE_OTHER): Payer: Medicare Other | Admitting: Cardiology

## 2017-10-04 VITALS — BP 138/80 | HR 60 | Ht 67.0 in | Wt 179.0 lb

## 2017-10-04 DIAGNOSIS — R9431 Abnormal electrocardiogram [ECG] [EKG]: Secondary | ICD-10-CM | POA: Diagnosis not present

## 2017-10-04 DIAGNOSIS — I493 Ventricular premature depolarization: Secondary | ICD-10-CM | POA: Diagnosis not present

## 2017-10-04 DIAGNOSIS — I10 Essential (primary) hypertension: Secondary | ICD-10-CM

## 2017-10-04 NOTE — Patient Instructions (Addendum)
No changes with current medications  Your physician recommends that you schedule a follow-up appointment on an as needed basis.

## 2017-10-04 NOTE — Progress Notes (Signed)
PCP: Hoyt Koch, MD  Clinic Note: Chief Complaint  Patient presents with  . Follow-up    less notable palpitations  . Palpitations    HPI: Antonio Ferrell is a 76 y.o. male with a PMH below who presents today for six-month follow-up of frequent PVCs and PACs noted on EKG.  Antonio Ferrell was first seen on 02/09/2017 at the request of Hoyt Koch, *. He noted that he was able to feel his irregular pulse (but had never noted it until his PCP asked him about it).   He really only notes the abnormal beats when he's feeling his pulse, he wouldn't otherwise feel anything unusual in his chest. He still does all kinds of exercise and activity. He goes to gym and works out and does all kinds of chores around the house. He is very active and exercises quite a bit without any significant symptoms.  He has not had any chest tightness or pressure with rest or exertion - He was evaluated with an Echocardiogram, 24hr Holter monitor & a GXT.  Studies Personally Reviewed - if available, images/films reviewed: From Epic Chart or Care EveryWhere:    Interval History: Jabe presents today doing very well.  He notes that over the last 2 months, his palpitations have become much less frequent.  And he does not even know that they are happening.  He has no untoward symptoms to speak of. Denies any rapid irregular beats.  No lightheadedness, dizziness or syncope/near syncope. Cardiovascular Review of Symptoms: no chest pain or dyspnea on exertion negative for - edema, orthopnea, paroxysmal nocturnal dyspnea, rapid heart rate, shortness of breath or TIA/amaurosis fugax   No claudication.  ROS:  Review of Systems  Constitutional: Negative for malaise/fatigue.  All other systems reviewed and are negative.   I have reviewed and (if needed) personally updated the patient's problem list, medications, allergies, past medical and surgical history, social and family history.   Past Medical  History:  Diagnosis Date  . GERD (gastroesophageal reflux disease)   . High blood pressure   . Nephrolithiasis   . Other dysphagia     Past Surgical History:  Procedure Laterality Date  . 24 hr Holter Monitor  02/24/2017   No worrisome arrhythmias. No symptoms noted.  Mostly NSR with some sinus bradycardia & tachycardia: Min HR 47 bpm; Max HR 122 bpm.  Monomorphic PVCs in singles, coupletes, trigeminy and bigeminy pattern - longest run of bigeminy ~14 beats.  Less frequent PACs with a shor run of PAT - longest run ~11 beats, 155 bpm; Some PACs are aberrantly conducted  . Exercise Tolerance Test  02/11/2017   LOW RISK:  Bruce protocol 10:46 min, 12.9 METS. Excellent exercise tolerance. Peak HR 162 (110% Max Predicted). Hypertensive response - 159/100 mmHg --> 202/105 mmHg. PVCs noted with stress - but no increase in PVCs. No CP noted & no ischemic ST segment changes.  Marland Kitchen HERNIA REPAIR     right groin  . TONSILLECTOMY    . TRANSTHORACIC ECHOCARDIOGRAM  02/24/2017   Normal LV size and function. EF 55-60%. Normal wall motion. GR 1 DD. Mild MR. Otherwise normal    Current Meds  Medication Sig  . aspirin 81 MG tablet Take 81 mg by mouth daily.    . hydrochlorothiazide (HYDRODIURIL) 25 MG tablet Take 1 tablet (25 mg total) by mouth daily. Keep 10/21/2017 appointment for further refills  . ibuprofen (ADVIL,MOTRIN) 200 MG tablet Take 400 mg by mouth every 6 (six) hours  as needed.  Marland Kitchen omeprazole (PRILOSEC) 10 MG capsule Take 10 mg by mouth daily.    No Known Allergies  Social History   Socioeconomic History  . Marital status: Married    Spouse name: None  . Number of children: 0  . Years of education: None  . Highest education level: None  Social Needs  . Financial resource strain: None  . Food insecurity - worry: None  . Food insecurity - inability: None  . Transportation needs - medical: None  . Transportation needs - non-medical: None  Occupational History  . Occupation: retired    Tobacco Use  . Smoking status: Never Smoker  . Smokeless tobacco: Never Used  Substance and Sexual Activity  . Alcohol use: No    Alcohol/week: 0.0 oz    Comment: rare use  . Drug use: No  . Sexual activity: None  Other Topics Concern  . 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 No children   Writer. Refurbished and restored a hypercub    family history includes Alcohol abuse in his father; Atrial fibrillation in his sister; Cancer in his maternal grandfather; Colon cancer in his paternal grandfather; Coronary artery disease in his sister; Heart attack in his paternal grandmother; Heart attack (age of onset: 50) in his father; Heart disease in his father; Heart failure in his maternal grandmother; Lung cancer in his mother; Stroke in his father.  Wt Readings from Last 3 Encounters:  10/04/17 179 lb (81.2 kg)  03/04/17 182 lb (82.6 kg)  02/09/17 182 lb (82.6 kg)    PHYSICAL EXAM BP 138/80   Pulse 60   Ht 5\' 7"  (1.702 m)   Wt 179 lb (81.2 kg)   SpO2 97%   BMI 28.04 kg/m   Physical Exam  Constitutional: He is oriented to person, place, and time. He appears well-developed and well-nourished. No distress.  Very healthy-appearing.  HENT:  Head: Normocephalic and atraumatic.  Eyes: EOM are normal.  Neck: No hepatojugular reflux and no JVD present. Carotid bruit is not present.  Cardiovascular: Normal rate, regular rhythm, normal heart sounds and intact distal pulses. Exam reveals no gallop and no friction rub.  No murmur heard. Nondisplaced PMI  Pulmonary/Chest: Effort normal and breath sounds normal. No respiratory distress. He has no wheezes. He has no rales.  Musculoskeletal: Normal range of motion. He exhibits no edema.   Neurological: He is alert and oriented to person, place, and time.  Psychiatric: He has a normal mood and affect. His behavior is normal. Judgment and thought content normal.  Nursing note and vitals reviewed.   Adult ECG Report n/a  Other studies Reviewed: Additional studies/ records that were reviewed today include:  Recent Labs:  n/a    ASSESSMENT / PLAN: Problem List Items Addressed This Visit    Abnormal finding on EKG    Resting bradycardia on previous EKG.  His heart rate are stable today      Asymptomatic PVCs - Primary (Chronic)    Intermittent PACs and PVCs occasionally in bigeminy and trigeminy form.  He seems to be doing fine with no further symptoms.  As he has not noticed him, I do not feel the need to treat with beta-blocker or calcium channel blocker. Continue  to monitor-return if symptoms recur      Hypertension (Chronic)    Borderline diastolic pressures today. Currently only on HCTZ.  No edema.         Current medicines are reviewed at length with the patient today. (+/- concerns) n/a The following changes have been made: n/a  Patient Instructions  No changes with current medications  Your physician recommends that you schedule a follow-up appointment on an as needed basis.  Studies Ordered:   No orders of the defined types were placed in this encounter.     Glenetta Hew, M.D., M.S. Interventional Cardiologist   Pager # [phone] Phone # [phone] [address]. [place],  [address]

## 2017-10-06 ENCOUNTER — Encounter: Payer: Self-pay | Admitting: Cardiology

## 2017-10-06 NOTE — Assessment & Plan Note (Signed)
Borderline diastolic pressures today. Currently only on HCTZ.  No edema.

## 2017-10-06 NOTE — Assessment & Plan Note (Signed)
Resting bradycardia on previous EKG.  His heart rate are stable today

## 2017-10-06 NOTE — Assessment & Plan Note (Signed)
Intermittent PACs and PVCs occasionally in bigeminy and trigeminy form.  He seems to be doing fine with no further symptoms.  As he has not noticed him, I do not feel the need to treat with beta-blocker or calcium channel blocker. Continue to monitor-return if symptoms recur

## 2017-10-21 ENCOUNTER — Ambulatory Visit (INDEPENDENT_AMBULATORY_CARE_PROVIDER_SITE_OTHER): Payer: Medicare Other | Admitting: Internal Medicine

## 2017-10-21 ENCOUNTER — Encounter: Payer: Self-pay | Admitting: Internal Medicine

## 2017-10-21 ENCOUNTER — Other Ambulatory Visit (INDEPENDENT_AMBULATORY_CARE_PROVIDER_SITE_OTHER): Payer: Medicare Other

## 2017-10-21 VITALS — BP 130/88 | HR 62 | Temp 98.0°F | Ht 67.0 in | Wt 178.0 lb

## 2017-10-21 DIAGNOSIS — E785 Hyperlipidemia, unspecified: Secondary | ICD-10-CM

## 2017-10-21 DIAGNOSIS — N400 Enlarged prostate without lower urinary tract symptoms: Secondary | ICD-10-CM | POA: Diagnosis not present

## 2017-10-21 DIAGNOSIS — I10 Essential (primary) hypertension: Secondary | ICD-10-CM

## 2017-10-21 DIAGNOSIS — K219 Gastro-esophageal reflux disease without esophagitis: Secondary | ICD-10-CM | POA: Diagnosis not present

## 2017-10-21 DIAGNOSIS — Z0000 Encounter for general adult medical examination without abnormal findings: Secondary | ICD-10-CM

## 2017-10-21 LAB — COMPREHENSIVE METABOLIC PANEL
ALK PHOS: 76 U/L (ref 39–117)
ALT: 16 U/L (ref 0–53)
AST: 22 U/L (ref 0–37)
Albumin: 4 g/dL (ref 3.5–5.2)
BUN: 17 mg/dL (ref 6–23)
CO2: 29 meq/L (ref 19–32)
Calcium: 9 mg/dL (ref 8.4–10.5)
Chloride: 104 mEq/L (ref 96–112)
Creatinine, Ser: 1.03 mg/dL (ref 0.40–1.50)
GFR: 74.78 mL/min (ref >60.00)
GLUCOSE: 72 mg/dL (ref 70–99)
POTASSIUM: 3.8 meq/L (ref 3.5–5.1)
SODIUM: 140 meq/L (ref 135–145)
TOTAL PROTEIN: 6.4 g/dL (ref 6.0–8.3)
Total Bilirubin: 0.8 mg/dL (ref 0.2–1.2)

## 2017-10-21 LAB — LIPID PANEL
CHOLESTEROL: 125 mg/dL (ref 0–200)
HDL: 40.5 mg/dL (ref >39.00)
LDL CALC: 63 mg/dL (ref 0–99)
NonHDL: 84.83
TRIGLYCERIDES: 109 mg/dL (ref 0.0–149.0)
Total CHOL/HDL Ratio: 3
VLDL: 21.8 mg/dL (ref 0.0–40.0)

## 2017-10-21 LAB — CBC
HEMATOCRIT: 47.9 % (ref 39.0–52.0)
Hemoglobin: 16.3 g/dL (ref 13.0–17.0)
MCHC: 34 g/dL (ref 30.0–36.0)
MCV: 89.6 fl (ref 78.0–100.0)
Platelets: 229 10*3/uL (ref 150.0–400.0)
RBC: 5.35 Mil/uL (ref 4.22–5.81)
RDW: 13.4 % (ref 11.5–15.5)
WBC: 7.9 10*3/uL (ref 4.0–10.5)

## 2017-10-21 LAB — PSA: PSA: 1.58 ng/mL (ref 0.10–4.00)

## 2017-10-21 NOTE — Progress Notes (Signed)
   Subjective:    Patient ID: Antonio Ferrell, male    DOB: 07/04/1941, 76 y.o.   MRN: [id number]  HPI Here for medicare wellness, no new complaints. Please see A/P for status and treatment of chronic medical problems.   HPI #2: Here for follow up of blood pressure (taking hctz and BP at goal, denies headaches or chest pains or SOB, denies side effects), and his gerd (taking omeprazole 1/2 pill daily, sometimes is able to skip days, if he goes more than 3 days he has problems, denies dark stools, abdominal pain, eats well before bedtime generally). 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   Diet: heart healthy Physical activity: active Depression/mood screen: negative Hearing: intact to whispered voice Visual acuity: grossly normal, performs annual eye exam Pilot so yearly ADLs: capable Fall risk: none Home safety: good Cognitive evaluation: intact to orientation, naming, recall and repetition EOL planning: adv directives discussed  I have personally reviewed and have noted 1. The patient's medical and social history - reviewed today no changes 2. Their use of alcohol, tobacco or illicit drugs 3. Their current medications and supplements 4. The patient's functional ability including ADL's, fall risks, home safety risks and hearing or visual impairment. 5. Diet and physical activities 6. Evidence for depression or mood disorders 7. Care team reviewed and updated (available in snapshot)  Review of Systems  Constitutional: Negative.   HENT: Negative.   Eyes: Negative.   Respiratory: Negative for cough, chest tightness and shortness of breath.   Cardiovascular: Negative for chest pain, palpitations and leg swelling.  Gastrointestinal: Negative for abdominal distention, abdominal pain, constipation, diarrhea, nausea and vomiting.  Musculoskeletal: Negative.   Skin: Negative.   Neurological: Negative.   Psychiatric/Behavioral: Negative.       Objective:   Physical  Exam  Constitutional: He is oriented to person, place, and time. He appears well-developed and well-nourished.  HENT:  Head: Normocephalic and atraumatic.  Eyes: EOM are normal.  Neck: Normal range of motion.  Cardiovascular: Normal rate and regular rhythm.  Pulmonary/Chest: Effort normal and breath sounds normal. No respiratory distress. He has no wheezes. He has no rales.  Abdominal: Soft. Bowel sounds are normal. He exhibits no distension. There is no tenderness. There is no rebound.  Musculoskeletal: He exhibits no edema.  Neurological: He is alert and oriented to person, place, and time. Coordination normal.  Skin: Skin is warm and dry.  Psychiatric: He has a normal mood and affect.   Vitals:   10/21/17 1041  BP: 130/88  Pulse: 62  Temp: 98 F (36.7 C)  TempSrc: Oral  SpO2: 98%  Weight: 178 lb (80.7 kg)  Height: 5\' 7"  (1.702 m)      Assessment & Plan:

## 2017-10-21 NOTE — Patient Instructions (Signed)
We will check the labs today and call you back about the results.    Health Maintenance, Male A healthy lifestyle and preventive care is important for your health and wellness. Ask your health care provider about what schedule of regular examinations is right for you. What should I know about weight and diet? Eat a Healthy Diet  Eat plenty of vegetables, fruits, whole grains, low-fat dairy products, and lean protein.  Do not eat a lot of foods high in solid fats, added sugars, or salt.  Maintain a Healthy Weight Regular exercise can help you achieve or maintain a healthy weight. You should:  Do at least 150 minutes of exercise each week. The exercise should increase your heart rate and make you sweat (moderate-intensity exercise).  Do strength-training exercises at least twice a week.  Watch Your Levels of Cholesterol and Blood Lipids  Have your blood tested for lipids and cholesterol every 5 years starting at 75 years of age. If you are at high risk for heart disease, you should start having your blood tested when you are 75 years old. You may need to have your cholesterol levels checked more often if: ? Your lipid or cholesterol levels are high. ? You are older than 75 years of age. ? You are at high risk for heart disease.  What should I know about cancer screening? Many types of cancers can be detected early and may often be prevented. Lung Cancer  You should be screened every year for lung cancer if: ? You are a current smoker who has smoked for at least 30 years. ? You are a former smoker who has quit within the past 15 years.  Talk to your health care provider about your screening options, when you should start screening, and how often you should be screened.  Colorectal Cancer  Routine colorectal cancer screening usually begins at 75 years of age and should be repeated every 5-10 years until you are 75 years old. You may need to be screened more often if early forms of  precancerous polyps or small growths are found. Your health care provider may recommend screening at an earlier age if you have risk factors for colon cancer.  Your health care provider may recommend using home test kits to check for hidden blood in the stool.  A small camera at the end of a tube can be used to examine your colon (sigmoidoscopy or colonoscopy). This checks for the earliest forms of colorectal cancer.  Prostate and Testicular Cancer  Depending on your age and overall health, your health care provider may do certain tests to screen for prostate and testicular cancer.  Talk to your health care provider about any symptoms or concerns you have about testicular or prostate cancer.  Skin Cancer  Check your skin from head to toe regularly.  Tell your health care provider about any new moles or changes in moles, especially if: ? There is a change in a mole's size, shape, or color. ? You have a mole that is larger than a pencil eraser.  Always use sunscreen. Apply sunscreen liberally and repeat throughout the day.  Protect yourself by wearing long sleeves, pants, a wide-brimmed hat, and sunglasses when outside.  What should I know about heart disease, diabetes, and high blood pressure?  If you are 65-3 years of age, have your blood pressure checked every 3-5 years. If you are 65 years of age or older, have your blood pressure checked every year. You should have  your blood pressure measured twice-once when you are at a hospital or clinic, and once when you are not at a hospital or clinic. Record the average of the two measurements. To check your blood pressure when you are not at a hospital or clinic, you can use: ? An automated blood pressure machine at a pharmacy. ? A home blood pressure monitor.  Talk to your health care provider about your target blood pressure.  If you are between 45-79 years old, ask your health care provider if you should take aspirin to prevent heart  disease.  Have regular diabetes screenings by checking your fasting blood sugar level. ? If you are at a normal weight and have a low risk for diabetes, have this test once every three years after the age of 45. ? If you are overweight and have a high risk for diabetes, consider being tested at a younger age or more often.  A one-time screening for abdominal aortic aneurysm (AAA) by ultrasound is recommended for men aged 65-75 years who are current or former smokers. What should I know about preventing infection? Hepatitis B If you have a higher risk for hepatitis B, you should be screened for this virus. Talk with your health care provider to find out if you are at risk for hepatitis B infection. Hepatitis C Blood testing is recommended for:  Everyone born from 1945 through 1965.  Anyone with known risk factors for hepatitis C.  Sexually Transmitted Diseases (STDs)  You should be screened each year for STDs including gonorrhea and chlamydia if: ? You are sexually active and are younger than 75 years of age. ? You are older than 75 years of age and your health care provider tells you that you are at risk for this type of infection. ? Your sexual activity has changed since you were last screened and you are at an increased risk for chlamydia or gonorrhea. Ask your health care provider if you are at risk.  Talk with your health care provider about whether you are at high risk of being infected with HIV. Your health care provider may recommend a prescription medicine to help prevent HIV infection.  What else can I do?  Schedule regular health, dental, and eye exams.  Stay current with your vaccines (immunizations).  Do not use any tobacco products, such as cigarettes, chewing tobacco, and e-cigarettes. If you need help quitting, ask your health care provider.  Limit alcohol intake to no more than 2 drinks per day. One drink equals 12 ounces of beer, 5 ounces of wine, or 1 ounces of  hard liquor.  Do not use street drugs.  Do not share needles.  Ask your health care provider for help if you need support or information about quitting drugs.  Tell your health care provider if you often feel depressed.  Tell your health care provider if you have ever been abused or do not feel safe at home. This information is not intended to replace advice given to you by your health care provider. Make sure you discuss any questions you have with your health care provider. Document Released: 04/16/2008 Document Revised: 06/17/2016 Document Reviewed: 07/23/2015 Elsevier Interactive Patient Education  2018 [employer]  

## 2017-10-22 MED ORDER — HYDROCHLOROTHIAZIDE 25 MG PO TABS: 25.0000 mg | ORAL_TABLET | Freq: Every day | ORAL | 3 refills | Status: DC

## 2017-10-22 NOTE — Assessment & Plan Note (Signed)
Flu and tetanus up to date. Pneumonia missing prevnar but he thinks he had this at pharmacy and will check records at home. Colonoscopy up to date. Counseled about sun safety and mole surveillance. Given 10 year screening recommendations.

## 2017-10-22 NOTE — Assessment & Plan Note (Signed)
Taking hctz 25 mg daily and BP at goal. Refilled today and checking CMP and adjust as needed.

## 2017-10-22 NOTE — Assessment & Plan Note (Signed)
Taking omeprazole 1/2 pill daily and will continue given prior esophageal dilation.

## 2017-11-11 DIAGNOSIS — L820 Inflamed seborrheic keratosis: Secondary | ICD-10-CM | POA: Diagnosis not present

## 2017-11-11 DIAGNOSIS — Z872 Personal history of diseases of the skin and subcutaneous tissue: Secondary | ICD-10-CM | POA: Diagnosis not present

## 2017-11-11 DIAGNOSIS — D234 Other benign neoplasm of skin of scalp and neck: Secondary | ICD-10-CM | POA: Diagnosis not present

## 2017-11-11 DIAGNOSIS — D235 Other benign neoplasm of skin of trunk: Secondary | ICD-10-CM | POA: Diagnosis not present

## 2017-11-11 DIAGNOSIS — Z85828 Personal history of other malignant neoplasm of skin: Secondary | ICD-10-CM | POA: Diagnosis not present

## 2017-11-11 DIAGNOSIS — D2361 Other benign neoplasm of skin of right upper limb, including shoulder: Secondary | ICD-10-CM | POA: Diagnosis not present

## 2017-11-11 DIAGNOSIS — D2362 Other benign neoplasm of skin of left upper limb, including shoulder: Secondary | ICD-10-CM | POA: Diagnosis not present

## 2017-11-11 DIAGNOSIS — D2321 Other benign neoplasm of skin of right ear and external auricular canal: Secondary | ICD-10-CM | POA: Diagnosis not present

## 2017-11-11 DIAGNOSIS — D23111 Other benign neoplasm of skin of right upper eyelid, including canthus: Secondary | ICD-10-CM | POA: Diagnosis not present

## 2017-11-11 DIAGNOSIS — D230 Other benign neoplasm of skin of lip: Secondary | ICD-10-CM | POA: Diagnosis not present

## 2017-11-11 DIAGNOSIS — D2339 Other benign neoplasm of skin of other parts of face: Secondary | ICD-10-CM | POA: Diagnosis not present

## 2017-11-11 DIAGNOSIS — D23122 Other benign neoplasm of skin of left lower eyelid, including canthus: Secondary | ICD-10-CM | POA: Diagnosis not present

## 2017-12-18 DIAGNOSIS — I10 Essential (primary) hypertension: Secondary | ICD-10-CM | POA: Diagnosis not present

## 2017-12-18 DIAGNOSIS — Z7982 Long term (current) use of aspirin: Secondary | ICD-10-CM | POA: Diagnosis not present

## 2017-12-18 DIAGNOSIS — R002 Palpitations: Secondary | ICD-10-CM | POA: Diagnosis not present

## 2017-12-18 DIAGNOSIS — Z79899 Other long term (current) drug therapy: Secondary | ICD-10-CM | POA: Diagnosis not present

## 2017-12-18 DIAGNOSIS — K219 Gastro-esophageal reflux disease without esophagitis: Secondary | ICD-10-CM | POA: Diagnosis not present

## 2017-12-18 DIAGNOSIS — I452 Bifascicular block: Secondary | ICD-10-CM | POA: Diagnosis not present

## 2017-12-18 DIAGNOSIS — R079 Chest pain, unspecified: Secondary | ICD-10-CM | POA: Diagnosis not present

## 2017-12-27 ENCOUNTER — Other Ambulatory Visit: Payer: Self-pay | Admitting: Internal Medicine

## 2017-12-30 ENCOUNTER — Other Ambulatory Visit: Payer: Self-pay | Admitting: Internal Medicine

## 2017-12-31 ENCOUNTER — Other Ambulatory Visit: Payer: Self-pay | Admitting: Internal Medicine

## 2018-01-08 DIAGNOSIS — R197 Diarrhea, unspecified: Secondary | ICD-10-CM | POA: Diagnosis not present

## 2018-01-08 DIAGNOSIS — Z7982 Long term (current) use of aspirin: Secondary | ICD-10-CM | POA: Diagnosis not present

## 2018-01-08 DIAGNOSIS — Z79899 Other long term (current) drug therapy: Secondary | ICD-10-CM | POA: Diagnosis not present

## 2018-01-08 DIAGNOSIS — R112 Nausea with vomiting, unspecified: Secondary | ICD-10-CM | POA: Diagnosis not present

## 2018-01-21 ENCOUNTER — Encounter: Payer: Self-pay | Admitting: Family Medicine

## 2018-01-21 ENCOUNTER — Ambulatory Visit (INDEPENDENT_AMBULATORY_CARE_PROVIDER_SITE_OTHER): Payer: Medicare Other | Admitting: Family Medicine

## 2018-01-21 VITALS — BP 104/68 | HR 72 | Temp 98.2°F | Ht 67.0 in | Wt 175.6 lb

## 2018-01-21 DIAGNOSIS — R1031 Right lower quadrant pain: Secondary | ICD-10-CM

## 2018-01-21 DIAGNOSIS — K5730 Diverticulosis of large intestine without perforation or abscess without bleeding: Secondary | ICD-10-CM | POA: Diagnosis not present

## 2018-01-21 DIAGNOSIS — R8290 Unspecified abnormal findings in urine: Secondary | ICD-10-CM

## 2018-01-21 DIAGNOSIS — R197 Diarrhea, unspecified: Secondary | ICD-10-CM

## 2018-01-21 LAB — COMPREHENSIVE METABOLIC PANEL
ALBUMIN: 4 g/dL (ref 3.5–5.2)
ALT: 28 U/L (ref 0–53)
AST: 32 U/L (ref 0–37)
Alkaline Phosphatase: 110 U/L (ref 39–117)
BILIRUBIN TOTAL: 1.5 mg/dL — AB (ref 0.2–1.2)
BUN: 15 mg/dL (ref 6–23)
CALCIUM: 9.1 mg/dL (ref 8.4–10.5)
CO2: 28 meq/L (ref 19–32)
Chloride: 101 mEq/L (ref 96–112)
Creatinine, Ser: 1.07 mg/dL (ref 0.40–1.50)
GFR: 71.52 mL/min (ref >60.00)
Glucose, Bld: 82 mg/dL (ref 70–99)
Potassium: 4.6 mEq/L (ref 3.5–5.1)
SODIUM: 137 meq/L (ref 135–145)
Total Protein: 6.7 g/dL (ref 6.0–8.3)

## 2018-01-21 LAB — POC URINALSYSI DIPSTICK (AUTOMATED)
Glucose, UA: NEGATIVE
Ketones, UA: NEGATIVE
NITRITE UA: NEGATIVE
PH UA: 6.5 (ref 5.0–8.0)
RBC UA: NEGATIVE
Spec Grav, UA: 1.015 (ref 1.010–1.025)
UROBILINOGEN UA: 0.2 U/dL

## 2018-01-21 LAB — CBC WITH DIFFERENTIAL/PLATELET
BASOS ABS: 0.1 10*3/uL (ref 0.0–0.1)
Basophils Relative: 0.7 % (ref 0.0–3.0)
EOS ABS: 0.1 10*3/uL (ref 0.0–0.7)
Eosinophils Relative: 0.6 % (ref 0.0–5.0)
HEMATOCRIT: 46.8 % (ref 39.0–52.0)
HEMOGLOBIN: 16.1 g/dL (ref 13.0–17.0)
LYMPHS PCT: 16.9 % (ref 12.0–46.0)
Lymphs Abs: 2.1 10*3/uL (ref 0.7–4.0)
MCHC: 34.4 g/dL (ref 30.0–36.0)
MCV: 89.2 fl (ref 78.0–100.0)
MONO ABS: 1.6 10*3/uL — AB (ref 0.1–1.0)
Monocytes Relative: 12.7 % — ABNORMAL HIGH (ref 3.0–12.0)
Neutro Abs: 8.6 10*3/uL — ABNORMAL HIGH (ref 1.4–7.7)
Neutrophils Relative %: 69.1 % (ref 43.0–77.0)
PLATELETS: 272 10*3/uL (ref 150.0–400.0)
RBC: 5.24 Mil/uL (ref 4.22–5.81)
RDW: 13.6 % (ref 11.5–15.5)
WBC: 12.5 10*3/uL — AB (ref 4.0–10.5)

## 2018-01-21 LAB — C-REACTIVE PROTEIN: CRP: 11.9 mg/dL (ref 0.5–20.0)

## 2018-01-21 MED ORDER — METRONIDAZOLE 500 MG PO TABS: 500.0000 mg | ORAL_TABLET | Freq: Three times a day (TID) | ORAL | 0 refills | Status: DC

## 2018-01-21 MED ORDER — CIPROFLOXACIN HCL 500 MG PO TABS: 500.0000 mg | ORAL_TABLET | Freq: Two times a day (BID) | ORAL | 0 refills | Status: DC

## 2018-01-21 NOTE — Progress Notes (Signed)
Antonio Ferrell , [birth year], 77 y.o., male MRN: [id number] Patient Care Team    Relationship Specialty Notifications Start End  Antonio Koch, MD PCP - General Internal Medicine  11/20/14     Chief Complaint  Patient presents with  . Diarrhea    pt c/o of gas, bloating cramp, nausea, vomiting, fever, chills and diarrhea X 2weeks. pt said he is feeling a lot bit better right now. Try Karena Addison pectate for temporal relief     Subjective: Pt presents for an OV with complaints of abdominal pain intermittently of 2 weeks duration.  Associated symptoms include about 2 weeks pt experienced non-bloody diarrhea x7 in a day and vomit. He thought maybe he ate something bad. No other family members affected. He felt better the following day. Then over the last 2 days he has experienced loose stools (not diarrhea), low grade fever, chills, decreased appetite, fatigue, stomach cramping, "rumbling", gas pains,  and lower midline/RLQ abd pain. He denies recent travel, abx use, or under prepared foods. He reports a h/o kidney stones in the past w/ similar abd discomfort, however he did not have GI issues at that time, like he does currently. He denies dysuria or changes in urinary stream today. He admit she does not drink much water. By review of 2013 colonoscopy report, pt had diverticulosis. He reports his Father had very bad diverticulosis and had to have partial colectomy.   Depression screen [facility] 2/9 10/21/2017 10/15/2016 10/11/2015  Decreased Interest 0 0 0  Down, Depressed, Hopeless 0 0 0  PHQ - 2 Score 0 0 0    No Known Allergies Social History   Tobacco Use  . Smoking status: Never Smoker  . Smokeless tobacco: Never Used  Substance Use Topics  . Alcohol use: No    Alcohol/week: 0.0 oz    Comment: rare use   Past Medical History:  Diagnosis Date  . GERD (gastroesophageal reflux disease)   . High blood pressure   . Nephrolithiasis   . Other dysphagia    Past Surgical History:    Procedure Laterality Date  . 24 hr Holter Monitor  02/24/2017   No worrisome arrhythmias. No symptoms noted.  Mostly NSR with some sinus bradycardia & tachycardia: Min HR 47 bpm; Max HR 122 bpm.  Monomorphic PVCs in singles, coupletes, trigeminy and bigeminy pattern - longest run of bigeminy ~14 beats.  Less frequent PACs with a shor run of PAT - longest run ~11 beats, 155 bpm; Some PACs are aberrantly conducted  . Exercise Tolerance Test  02/11/2017   LOW RISK:  Bruce protocol 10:46 min, 12.9 METS. Excellent exercise tolerance. Peak HR 162 (110% Max Predicted). Hypertensive response - 159/100 mmHg --> 202/105 mmHg. PVCs noted with stress - but no increase in PVCs. No CP noted & no ischemic ST segment changes.  Marland Kitchen HERNIA REPAIR     right groin  . TONSILLECTOMY    . TRANSTHORACIC ECHOCARDIOGRAM  02/24/2017   Normal LV size and function. EF 55-60%. Normal wall motion. GR 1 DD. Mild MR. Otherwise normal   Family History  Problem Relation Age of Onset  . Lung cancer Mother   . Heart disease Father        CAD., Atrial fibrillation  . Heart attack Father 31       Multiple  . Alcohol abuse Father   . Stroke Father   . Colon cancer Paternal Grandfather   . Heart failure Maternal Grandmother   . Cancer Maternal Grandfather   .  Heart attack Paternal Grandmother   . Atrial fibrillation Sister   . Coronary artery disease Sister   . Esophageal cancer Neg Hx   . Stomach cancer Neg Hx   . Rectal cancer Neg Hx   . Other Neg Hx        hypogonadism   Allergies as of 01/21/2018   No Known Allergies     Medication List        Accurate as of 01/21/18 11:13 AM. Always use your most recent med list.          aspirin 81 MG tablet Take 81 mg by mouth daily.   hydrochlorothiazide 25 MG tablet Commonly known as:  HYDRODIURIL Take 1 tablet (25 mg total) by mouth daily.   ibuprofen 200 MG tablet Commonly known as:  ADVIL,MOTRIN Take 400 mg by mouth every 6 (six) hours as needed.   omeprazole  10 MG capsule Commonly known as:  PRILOSEC Take 10 mg by mouth daily.       All past medical history, surgical history, allergies, family history, immunizations andmedications were updated in the EMR today and reviewed under the history and medication portions of their EMR.     ROS: Negative, with the exception of above mentioned in HPI   Objective:  BP 104/68 (BP Location: Left Arm, Patient Position: Sitting, Cuff Size: Normal)   Pulse 72   Temp 98.2 F (36.8 C) (Oral)   Ht 5\' 7"  (1.702 m)   Wt 175 lb 9.6 oz (79.7 kg)   SpO2 96%   BMI 27.50 kg/m  Body mass index is 27.5 kg/m. Gen: Afebrile. No acute distress. Nontoxic in appearance, well developed, well nourished.  HENT: AT. .  MMM, no oral lesions.  Eyes:Pupils Equal Round Reactive to light, Extraocular movements intact,  Conjunctiva without redness, discharge or icterus. CV: RRR, no edema Chest: CTAB, no wheeze or crackles. Good air movement, normal resp effort.  Abd: Soft. flat. ND. Mild TTP RLQ and suprapubic. BS present. no Masses. No rebound or guarding. Neg McBurney.  Skin: no rashes, purpura or petechiae.  Neuro:  Normal gait. PERLA. EOMi. Alert. Oriented x3  No exam data present No results found. No results found for this or any previous visit (from the past 24 hour(s)).  Assessment/Plan: Antonio Ferrell is a 77 y.o. male present for OV for  RLQ abdominal pain/diarrhea/abnormal urine/diverticulosis - VSS. Poss. Mild dehydration on exam. Lower and diffusely tender > RLQ. No red flags on exam. Discussed outpatient tx of possible diverticulosis or kidney stones. If sx worsening over the weekend he should have a low threshold to present to ED. Pt reports understanding.  - Will collect labs today and start prophylactic tx w/ cipro/flagyl. If not responding CT abd/pelvis would be needed.  - Pt encouraged to PUSH fluids. He declined zofran today.  - pain tolerable, no need for pain med. OTC tx if necessary.  - POCT  Urinalysis Dipstick (Automated) - CBC with Differential/Platelet - Comprehensive metabolic panel - C-reactive protein - Urine Culture - F/U PCP 1 week.      Reviewed expectations re: course of current medical issues.  Discussed self-management of symptoms.  Outlined signs and symptoms indicating need for more acute intervention.  Patient verbalized understanding and all questions were answered.  Patient received an After-Visit Summary.    No orders of the defined types were placed in this encounter.    Note is dictated utilizing voice recognition software. Although note has been proof read prior to signing,  occasional typographical errors still can be missed. If any questions arise, please do not hesitate to call for verification.   electronically signed by:  Howard Pouch, DO  [place]

## 2018-01-21 NOTE — Patient Instructions (Signed)
I will call you with results on Monday.  You take cipro and flagyl as directed. Make sure to stay well hydrated.    Diverticulitis Diverticulitis is when small pockets in your large intestine (colon) get infected or swollen. This causes stomach pain and watery poop (diarrhea). These pouches are called diverticula. They form in people who have a condition called diverticulosis. Follow these instructions at home: Medicines  Take over-the-counter and prescription medicines only as told by your doctor. These include: ? Antibiotics. ? Pain medicines. ? Fiber pills. ? Probiotics. ? Stool softeners.  Do not drive or use heavy machinery while taking prescription pain medicine.  If you were prescribed an antibiotic, take it as told. Do not stop taking it even if you feel better. General instructions  Follow a diet as told by your doctor.  When you feel better, your doctor may tell you to change your diet. You may need to eat a lot of fiber. Fiber makes it easier to poop (have bowel movements). Healthy foods with fiber include: ? Berries. ? Beans. ? Lentils. ? Green vegetables.  Exercise 3 or more times a week. Aim for 30 minutes each time. Exercise enough to sweat and make your heart beat faster.  Keep all follow-up visits as told. This is important. You may need to have an exam of the large intestine. This is called a colonoscopy. Contact a doctor if:  Your pain does not get better.  You have a hard time eating or drinking.  You are not pooping like normal. Get help right away if:  Your pain gets worse.  Your problems do not get better.  Your problems get worse very fast.  You have a fever.  You throw up (vomit) more than one time.  You have poop that is: ? Bloody. ? Black. ? Tarry. Summary  Diverticulitis is when small pockets in your large intestine (colon) get infected or swollen.  Take medicines only as told by your doctor.  Follow a diet as told by your  doctor. This information is not intended to replace advice given to you by your health care provider. Make sure you discuss any questions you have with your health care provider. Document Released: 04/06/2008 Document Revised: 11/05/2016 Document Reviewed: 11/05/2016 Elsevier Interactive Patient Education  2017 [employer].

## 2018-01-22 LAB — URINE CULTURE
MICRO NUMBER: 90365930
Result:: NO GROWTH
SPECIMEN QUALITY: ADEQUATE

## 2018-07-20 DIAGNOSIS — L821 Other seborrheic keratosis: Secondary | ICD-10-CM | POA: Diagnosis not present

## 2018-07-20 DIAGNOSIS — D1801 Hemangioma of skin and subcutaneous tissue: Secondary | ICD-10-CM | POA: Diagnosis not present

## 2018-07-20 DIAGNOSIS — L578 Other skin changes due to chronic exposure to nonionizing radiation: Secondary | ICD-10-CM | POA: Diagnosis not present

## 2018-08-12 DIAGNOSIS — H25813 Combined forms of age-related cataract, bilateral: Secondary | ICD-10-CM | POA: Diagnosis not present

## 2018-08-12 DIAGNOSIS — H3562 Retinal hemorrhage, left eye: Secondary | ICD-10-CM | POA: Diagnosis not present

## 2018-08-30 DIAGNOSIS — Z23 Encounter for immunization: Secondary | ICD-10-CM | POA: Diagnosis not present

## 2018-10-18 DIAGNOSIS — L578 Other skin changes due to chronic exposure to nonionizing radiation: Secondary | ICD-10-CM | POA: Diagnosis not present

## 2018-10-18 DIAGNOSIS — L821 Other seborrheic keratosis: Secondary | ICD-10-CM | POA: Diagnosis not present

## 2018-10-18 DIAGNOSIS — D1801 Hemangioma of skin and subcutaneous tissue: Secondary | ICD-10-CM | POA: Diagnosis not present

## 2018-11-30 ENCOUNTER — Ambulatory Visit (INDEPENDENT_AMBULATORY_CARE_PROVIDER_SITE_OTHER): Payer: Medicare Other | Admitting: Internal Medicine

## 2018-11-30 ENCOUNTER — Other Ambulatory Visit (INDEPENDENT_AMBULATORY_CARE_PROVIDER_SITE_OTHER): Payer: Medicare Other

## 2018-11-30 ENCOUNTER — Ambulatory Visit (INDEPENDENT_AMBULATORY_CARE_PROVIDER_SITE_OTHER): Payer: Medicare Other | Admitting: *Deleted

## 2018-11-30 ENCOUNTER — Encounter: Payer: Self-pay | Admitting: Internal Medicine

## 2018-11-30 VITALS — BP 126/90 | HR 75 | Temp 98.2°F | Ht 67.0 in | Wt 172.0 lb

## 2018-11-30 VITALS — BP 126/90 | HR 75 | Ht 67.0 in | Wt 172.0 lb

## 2018-11-30 DIAGNOSIS — K219 Gastro-esophageal reflux disease without esophagitis: Secondary | ICD-10-CM | POA: Diagnosis not present

## 2018-11-30 DIAGNOSIS — Z0000 Encounter for general adult medical examination without abnormal findings: Secondary | ICD-10-CM | POA: Diagnosis not present

## 2018-11-30 DIAGNOSIS — I10 Essential (primary) hypertension: Secondary | ICD-10-CM

## 2018-11-30 DIAGNOSIS — N400 Enlarged prostate without lower urinary tract symptoms: Secondary | ICD-10-CM | POA: Diagnosis not present

## 2018-11-30 DIAGNOSIS — K5730 Diverticulosis of large intestine without perforation or abscess without bleeding: Secondary | ICD-10-CM

## 2018-11-30 LAB — COMPREHENSIVE METABOLIC PANEL
ALK PHOS: 70 U/L (ref 39–117)
ALT: 13 U/L (ref 0–53)
AST: 19 U/L (ref 0–37)
Albumin: 4.1 g/dL (ref 3.5–5.2)
BILIRUBIN TOTAL: 0.8 mg/dL (ref 0.2–1.2)
BUN: 15 mg/dL (ref 6–23)
CALCIUM: 9.3 mg/dL (ref 8.4–10.5)
CO2: 29 meq/L (ref 19–32)
Chloride: 102 mEq/L (ref 96–112)
Creatinine, Ser: 1.07 mg/dL (ref 0.40–1.50)
GFR: 67.13 mL/min (ref >60.00)
Glucose, Bld: 100 mg/dL — ABNORMAL HIGH (ref 70–99)
Potassium: 3.7 mEq/L (ref 3.5–5.1)
Sodium: 139 mEq/L (ref 135–145)
TOTAL PROTEIN: 6.1 g/dL (ref 6.0–8.3)

## 2018-11-30 LAB — PSA: PSA: 1.78 ng/mL (ref 0.10–4.00)

## 2018-11-30 LAB — CBC
HCT: 47.6 % (ref 39.0–52.0)
HEMOGLOBIN: 16.5 g/dL (ref 13.0–17.0)
MCHC: 34.7 g/dL (ref 30.0–36.0)
MCV: 87.8 fl (ref 78.0–100.0)
PLATELETS: 215 10*3/uL (ref 150.0–400.0)
RBC: 5.41 Mil/uL (ref 4.22–5.81)
RDW: 13.1 % (ref 11.5–15.5)
WBC: 7.9 10*3/uL (ref 4.0–10.5)

## 2018-11-30 LAB — LIPID PANEL
Cholesterol: 138 mg/dL (ref 0–200)
HDL: 33.9 mg/dL — ABNORMAL LOW (ref >39.00)
NONHDL: 103.67
TRIGLYCERIDES: 241 mg/dL — AB (ref 0.0–149.0)
Total CHOL/HDL Ratio: 4
VLDL: 48.2 mg/dL — ABNORMAL HIGH (ref 0.0–40.0)

## 2018-11-30 LAB — LDL CHOLESTEROL, DIRECT: Direct LDL: 72 mg/dL

## 2018-11-30 NOTE — Progress Notes (Signed)
   Subjective:   Patient ID: Antonio Ferrell, male    DOB: 12/23/1940, 78 y.o.   MRN: [id number]  HPI The patient is a 78 YO man coming in for follow up of blood pressure (taking hctz daily, denies side effects, denies headaches or chest pains), and GERD (taking omeprazole and reasonable control of GERD, denies stomach pains currently), and his diverticulosis (had flare of diverticulitis last March treated with antibiotics and resolved, denies current symptoms, eating normally without problems).   Review of Systems  Constitutional: Negative.   HENT: Negative.   Eyes: Negative.   Respiratory: Negative for cough, chest tightness and shortness of breath.   Cardiovascular: Negative for chest pain, palpitations and leg swelling.  Gastrointestinal: Negative for abdominal distention, abdominal pain, constipation, diarrhea, nausea and vomiting.  Musculoskeletal: Negative.   Skin: Negative.   Neurological: Negative.   Psychiatric/Behavioral: Negative.     Objective:  Physical Exam Constitutional:      Appearance: He is well-developed.  HENT:     Head: Normocephalic and atraumatic.  Neck:     Musculoskeletal: Normal range of motion.  Cardiovascular:     Rate and Rhythm: Normal rate and regular rhythm.  Pulmonary:     Effort: Pulmonary effort is normal. No respiratory distress.     Breath sounds: Normal breath sounds. No wheezing or rales.  Abdominal:     General: Bowel sounds are normal. There is no distension.     Palpations: Abdomen is soft.     Tenderness: There is no abdominal tenderness. There is no rebound.  Skin:    General: Skin is warm and dry.  Neurological:     Mental Status: He is alert and oriented to person, place, and time.     Coordination: Coordination normal.     Vitals:   11/30/18 1016  BP: 126/90  Pulse: 75  Temp: 98.2 F (36.8 C)  TempSrc: Oral  SpO2: 98%  Weight: 172 lb (78 kg)  Height: 5\' 7"  (1.702 m)    Assessment & Plan:

## 2018-11-30 NOTE — Patient Instructions (Signed)
Health Maintenance After Age 77 After age 77, you are at a higher risk for certain long-term diseases and infections as well as injuries from falls. Falls are a major cause of broken bones and head injuries in people who are older than age 77. Getting regular preventive care can help to keep you healthy and well. Preventive care includes getting regular testing and making lifestyle changes as recommended by your health care provider. Talk with your health care provider about:  Which screenings and tests you should have. A screening is a test that checks for a disease when you have no symptoms.  A diet and exercise plan that is right for you. What should I know about screenings and tests to prevent falls? Screening and testing are the best ways to find a health problem early. Early diagnosis and treatment give you the best chance of managing medical conditions that are common after age 77. Certain conditions and lifestyle choices may make you more likely to have a fall. Your health care provider may recommend:  Regular vision checks. Poor vision and conditions such as cataracts can make you more likely to have a fall. If you wear glasses, make sure to get your prescription updated if your vision changes.  Medicine review. Work with your health care provider to regularly review all of the medicines you are taking, including over-the-counter medicines. Ask your health care provider about any side effects that may make you more likely to have a fall. Tell your health care provider if any medicines that you take make you feel dizzy or sleepy.  Osteoporosis screening. Osteoporosis is a condition that causes the bones to get weaker. This can make the bones weak and cause them to break more easily.  Blood pressure screening. Blood pressure changes and medicines to control blood pressure can make you feel dizzy.  Strength and balance checks. Your health care provider may recommend certain tests to check your  strength and balance while standing, walking, or changing positions.  Foot health exam. Foot pain and numbness, as well as not wearing proper footwear, can make you more likely to have a fall.  Depression screening. You may be more likely to have a fall if you have a fear of falling, feel emotionally low, or feel unable to do activities that you used to do.  Alcohol use screening. Using too much alcohol can affect your balance and may make you more likely to have a fall. What actions can I take to lower my risk of falls? General instructions  Talk with your health care provider about your risks for falling. Tell your health care provider if: ? You fall. Be sure to tell your health care provider about all falls, even ones that seem minor. ? You feel dizzy, sleepy, or off-balance.  Take over-the-counter and prescription medicines only as told by your health care provider. These include any supplements.  Eat a healthy diet and maintain a healthy weight. A healthy diet includes low-fat dairy products, low-fat (lean) meats, and fiber from whole grains, beans, and lots of fruits and vegetables. Home safety  Remove any tripping hazards, such as rugs, cords, and clutter.  Install safety equipment such as grab bars in bathrooms and safety rails on stairs.  Keep rooms and walkways well-lit. Activity   Follow a regular exercise program to stay fit. This will help you maintain your balance. Ask your health care provider what types of exercise are appropriate for you.  If you need a cane or   walker, use it as recommended by your health care provider.  Wear supportive shoes that have nonskid soles. Lifestyle  Do not drink alcohol if your health care provider tells you not to drink.  If you drink alcohol, limit how much you have: ? 0-1 drink a day for women. ? 0-2 drinks a day for men.  Be aware of how much alcohol is in your drink. In the [place] one drink equals one typical bottle of beer (12  oz), one-half glass of wine (5 oz), or one shot of hard liquor (1 oz).  Do not use any products that contain nicotine or tobacco, such as cigarettes and e-cigarettes. If you need help quitting, ask your health care provider. Summary  Having a healthy lifestyle and getting preventive care can help to protect your health and wellness after age 77.  Screening and testing are the best way to find a health problem early and help you avoid having a fall. Early diagnosis and treatment give you the best chance for managing medical conditions that are more common for people who are older than age 77.  Falls are a major cause of broken bones and head injuries in people who are older than age 77. Take precautions to prevent a fall at home.  Work with your health care provider to learn what changes you can make to improve your health and wellness and to prevent falls. This information is not intended to replace advice given to you by your health care provider. Make sure you discuss any questions you have with your health care provider. Document Released: 09/01/2017 Document Revised: 09/01/2017 Document Reviewed: 09/01/2017 Elsevier Interactive Patient Education  2019 [employer]  

## 2018-11-30 NOTE — Assessment & Plan Note (Signed)
Checking labs and adjust hctz 25 mg daily as needed. BP at goal. Home average 135/83.

## 2018-11-30 NOTE — Progress Notes (Signed)
Medical screening examination/treatment/procedure(s) were performed by non-physician practitioner and as supervising physician I was immediately available for consultation/collaboration. I agree with above. Saniah Schroeter A Lastacia Solum, MD 

## 2018-11-30 NOTE — Assessment & Plan Note (Signed)
No flare currently. Eating normally and colonoscopy up to date.

## 2018-11-30 NOTE — Assessment & Plan Note (Signed)
Stable on omeprazole 10 mg daily and has tried to stop with recurrent symptoms with return of symptoms.

## 2018-11-30 NOTE — Progress Notes (Signed)
Subjective:   Antonio Ferrell is a 78 y.o. male who presents for Medicare Annual/Subsequent preventive examination.  Review of Systems:  No ROS.  Medicare Wellness Visit. Additional risk factors are reflected in the social history.  Cardiac Risk Factors include: advanced age (>20men, >66 women);male gender;hypertension Sleep patterns: feels rested on waking, gets up 1 times nightly to void and sleeps 7-8 hours nightly.    Home Safety/Smoke Alarms: Feels safe in home. Smoke alarms in place.  Living environment; residence and Firearm Safety: 1-story house/ trailer. Lives with wife, no needs for DME, good support system Seat Belt Safety/Bike Helmet: Wears seat belt.   PSA-  Lab Results  Component Value Date   PSA 1.58 10/21/2017   PSA 1.68 10/15/2016   PSA 1.35 11/05/2015       Objective:    Vitals: BP 126/90   Pulse 75   Ht 5\' 7"  (1.702 m)   Wt 172 lb (78 kg)   SpO2 98%   BMI 26.94 kg/m   Body mass index is 26.94 kg/m.  Advanced Directives 10/11/2015  Does Patient Have a Medical Advance Directive? Yes  Copy of [place] in Chart? Yes    Tobacco Social History   Tobacco Use  Smoking Status Never Smoker  Smokeless Tobacco Never Used     Counseling given: Not Answered  Past Medical History:  Diagnosis Date  . GERD (gastroesophageal reflux disease)   . High blood pressure   . Nephrolithiasis   . Other dysphagia    Past Surgical History:  Procedure Laterality Date  . 24 hr Holter Monitor  02/24/2017   No worrisome arrhythmias. No symptoms noted.  Mostly NSR with some sinus bradycardia & tachycardia: Min HR 47 bpm; Max HR 122 bpm.  Monomorphic PVCs in singles, coupletes, trigeminy and bigeminy pattern - longest run of bigeminy ~14 beats.  Less frequent PACs with a shor run of PAT - longest run ~11 beats, 155 bpm; Some PACs are aberrantly conducted  . Exercise Tolerance Test  02/11/2017   LOW RISK:  Bruce protocol 10:46 min, 12.9 METS. Excellent  exercise tolerance. Peak HR 162 (110% Max Predicted). Hypertensive response - 159/100 mmHg --> 202/105 mmHg. PVCs noted with stress - but no increase in PVCs. No CP noted & no ischemic ST segment changes.  Marland Kitchen HERNIA REPAIR     right groin  . TONSILLECTOMY    . TRANSTHORACIC ECHOCARDIOGRAM  02/24/2017   Normal LV size and function. EF 55-60%. Normal wall motion. GR 1 DD. Mild MR. Otherwise normal   Family History  Problem Relation Age of Onset  . Lung cancer Mother   . Heart disease Father        CAD., Atrial fibrillation  . Heart attack Father 42       Multiple  . Alcohol abuse Father   . Stroke Father   . Colon cancer Paternal Grandfather   . Heart failure Maternal Grandmother   . Cancer Maternal Grandfather   . Heart attack Paternal Grandmother   . Atrial fibrillation Sister   . Coronary artery disease Sister   . Esophageal cancer Neg Hx   . Stomach cancer Neg Hx   . Rectal cancer Neg Hx   . Other Neg Hx        hypogonadism   Social History   Socioeconomic History  . Marital status: Married    Spouse name: Not on file  . Number of children: 0  . Years of education: Not on  file  . Highest education level: Not on file  Occupational History  . Occupation: retired  Scientific laboratory technician  . Financial resource strain: Not hard at all  . Food insecurity:    Worry: Never true    Inability: Never true  . Transportation needs:    Medical: No    Non-medical: No  Tobacco Use  . Smoking status: Never Smoker  . Smokeless tobacco: Never Used  Substance and Sexual Activity  . Alcohol use: No    Alcohol/week: 0.0 standard drinks    Comment: rare use  . Drug use: No  . Sexual activity: Yes  Lifestyle  . Physical activity:    Days per week: 7 days    Minutes per session: 60 min  . Stress: Not at all  Relationships  . Social connections:    Talks on phone: More than three times a week    Gets together: More than three times a week    Attends religious service: More than 4 times  per year    Active member of club or organization: Yes    Attends meetings of clubs or organizations: More than 4 times per year    Relationship status: Married  Other Topics Concern  . 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 No children   Writer. Refurbished and restored a hypercub    Outpatient Encounter Medications as of 11/30/2018  Medication Sig  . aspirin 81 MG tablet Take 81 mg by mouth daily.    . hydrochlorothiazide (HYDRODIURIL) 25 MG tablet Take 1 tablet (25 mg total) by mouth daily.  Marland Kitchen ibuprofen (ADVIL,MOTRIN) 200 MG tablet Take 400 mg by mouth every 6 (six) hours as needed.  Marland Kitchen omeprazole (PRILOSEC) 10 MG capsule Take 10 mg by mouth daily.  . [DISCONTINUED] ciprofloxacin (CIPRO) 500 MG tablet Take 1 tablet (500 mg total) by mouth 2 (two) times daily.  . [DISCONTINUED] metroNIDAZOLE (FLAGYL) 500 MG tablet Take 1 tablet (500 mg total) by mouth 3 (three) times daily.   No facility-administered encounter medications on file as of 11/30/2018.     Activities of Daily Living In your present state of health, do you have any difficulty performing the following activities: 11/30/2018  Hearing? N  Vision? N  Difficulty concentrating or making decisions? N  Walking or climbing stairs? N  Dressing or bathing? N  Doing errands, shopping? N  Preparing Food and eating ? N  Using the Toilet? N  In the past six months, have you accidently leaked urine? N  Do you have problems with loss of bowel control? N  Managing your Medications? N  Managing your Finances? N  Housekeeping or managing your Housekeeping? N  Some recent data might be hidden    Patient Care Team: Hoyt Koch, MD as PCP - General (Internal Medicine)     Assessment:   This is a routine wellness examination for Antonio Ferrell. Physical assessment deferred to PCP.  Exercise Activities and Dietary recommendations Current Exercise Habits: Structured exercise class;Home exercise routine, Type of exercise: walking;strength training/weights;calisthenics, Time (Minutes): 60, Frequency (Times/Week): 6, Weekly Exercise (Minutes/Week): 360, Intensity: Mild, Exercise limited by: None identified  Diet (meal preparation, eat out, water intake, caffeinated beverages, dairy products, fruits and vegetables): in  general, a "healthy" diet  , well balanced . eats a variety of fruits and vegetables daily, limits salt, fat/cholesterol, sugar,carbohydrates,caffeine, drinks 6-8 glasses of water daily.   Goals      Patient Stated   . maintain (pt-stated)     Wants to stay healthy; weight down; off meds; have more energy;  Try to cut back on sweets; DASH Eating Plan DASH stands for "Dietary Approaches to Stop Hypertension." The DASH eating plan is a healthy eating plan that has been shown to reduce high blood pressure (hypertension). Additional health benefits may include reducing the risk of type 2 diabetes mellitus, heart disease, and stroke. The DASH eating plan may also help with weight loss. WHAT DO I NEED TO KNOW ABOUT THE DASH EATING PLAN? For the DASH eating plan, you will follow these general guidelines:  Choose foods with a percent daily value for sodium of less than 5% (as listed on the food label).  Use salt-free seasonings or herbs instead of table salt or sea salt.  Check with your health care provider or pharmacist before using salt substitutes.  Eat lower-sodium products, often labeled as "lower sodium" or "no salt added."  Eat fresh foods.  Eat more vegetables, fruits, and low-fat dairy products.  Choose whole grains. Look for the word "whole" as the first word in the ingredient list.  Choose fish and skinless chicken or [place] more often than red  meat. Limit fish, poultry, and meat to 6 oz (170 g) each day.  Limit sweets, desserts, sugars, and sugary drinks.  Choose heart-healthy fats.  Limit cheese to 1 oz (28 g) per day.  Eat more home-cooked food and less restaurant, buffet, and fast food.  Limit fried foods.  Cook foods using methods other than frying.  Limit canned vegetables. If you do use them, rinse them well to decrease the sodium.  When eating at a restaurant, ask that your food be prepared with less salt, or no salt if possible. WHAT FOODS CAN I EAT? Seek help from a dietitian for individual calorie needs. Grains Whole grain or whole wheat bread. Brown rice. Whole grain or whole wheat pasta. Quinoa, bulgur, and whole grain cereals. Low-sodium cereals. Corn or whole wheat flour tortillas. Whole grain cornbread. Whole grain crackers. Low-sodium crackers. Vegetables Fresh or frozen vegetables (raw, steamed, roasted, or grilled). Low-sodium or reduced-sodium tomato and vegetable juices. Low-sodium or reduced-sodium tomato sauce and paste. Low-sodium or reduced-sodium canned vegetables.  Fruits All fresh, canned (in natural juice), or frozen fruits. Meat and Other Protein Products Ground beef (85% or leaner), grass-fed beef, or beef trimmed of fat. Skinless chicken or [place]. Ground chicken or [place]. Pork trimmed of fat. All fish and seafood. Eggs. Dried beans, peas, or lentils. Unsalted nuts and seeds. Unsalted canned beans. Dairy Low-fat dairy products, such as skim or 1% milk, 2% or reduced-fat cheeses, low-fat ricotta or cottage cheese, or plain low-fat yogurt. Low-sodium or reduced-sodium cheeses. Fats and Oils Tub margarines without trans fats. Light or reduced-fat mayonnaise and salad dressings (reduced sodium). Avocado. Safflower, olive, or canola oils. Natural peanut or almond butter. Other Unsalted popcorn and pretzels. The items listed above may not be a complete list of recommended foods or beverages.  Contact your dietitian for more options. WHAT FOODS ARE NOT RECOMMENDED? Grains White bread. White pasta. White rice. Refined cornbread. Bagels and croissants. Crackers that contain trans fat. Vegetables Creamed or fried vegetables. Vegetables in a cheese sauce. Regular canned vegetables. Regular canned tomato sauce and paste.  Regular tomato and vegetable juices. Fruits Dried fruits. Canned fruit in light or heavy syrup. Fruit juice. Meat and Other Protein Products Fatty cuts of meat. Ribs, chicken wings, bacon, sausage, bologna, salami, chitterlings, fatback, hot dogs, bratwurst, and packaged luncheon meats. Salted nuts and seeds. Canned beans with salt. Dairy Whole or 2% milk, cream, half-and-half, and cream cheese. Whole-fat or sweetened yogurt. Full-fat cheeses or blue cheese. Nondairy creamers and whipped toppings. Processed cheese, cheese spreads, or cheese curds. Condiments Onion and garlic salt, seasoned salt, table salt, and sea salt. Canned and packaged gravies. Worcestershire sauce. Tartar sauce. Barbecue sauce. Teriyaki sauce. Soy sauce, including reduced sodium. Steak sauce. Fish sauce. Oyster sauce. Cocktail sauce. Horseradish. Ketchup and mustard. Meat flavorings and tenderizers. Bouillon cubes. Hot sauce. Tabasco sauce. Marinades. Taco seasonings. Relishes. Fats and Oils Butter, stick margarine, lard, shortening, ghee, and bacon fat. Coconut, palm kernel, or palm oils. Regular salad dressings. Other Pickles and olives. Salted popcorn and pretzels. The items listed above may not be a complete list of foods and beverages to avoid. Contact your dietitian for more information. WHERE CAN I FIND MORE INFORMATION? National Heart, Lung, and Blood Institute: travelstabloid.com   This information is not intended to replace advice given to you by your health care provider. Make sure you discuss any questions you have with your health care provider.     Document Released: 10/08/2011 Document Revised: 11/09/2014 Document Reviewed: 08/23/2013 Elsevier Interactive Patient Education 2016 [place]  11/30/2018 10/21/2017 10/15/2016 10/11/2015  Falls in the past year? 0 No No No    Depression Screen PHQ 2/9 Scores 11/30/2018 10/21/2017 10/15/2016 10/11/2015  PHQ - 2 Score 0 0 0 0    Cognitive Function       Ad8 score reviewed for issues:  Issues making decisions: no  Less interest in hobbies / activities: no  Repeats questions, stories (family complaining): no  Trouble using ordinary gadgets (microwave, computer, phone):no  Forgets the month or year: no  Mismanaging finances: no  Remembering appts: no  Daily problems with thinking and/or memory: no Ad8 score is= 0  Immunization History  Administered Date(s) Administered  . Influenza Split 09/01/2011, 09/02/2017, 08/31/2018  . Influenza, High Dose Seasonal PF 10/11/2015, 10/15/2016  . Influenza,inj,Quad PF,6+ Mos 09/17/2014  . Pneumococcal Polysaccharide-23 11/20/2014  . Tdap 10/19/2011  . Zoster 02/20/2008  . Zoster Recombinat (Shingrix) 02/19/2017, 05/21/2017   Screening Tests Health Maintenance  Topic Date Due  . URINE MICROALBUMIN  08/03/1951  . PNA vac Low Risk Adult (2 of 2 - PCV13) 11/21/2015  . TETANUS/TDAP  10/18/2021  . INFLUENZA VACCINE  Completed       Plan:     Continue doing brain stimulating activities (puzzles, reading, adult coloring books, staying active) to keep memory sharp.   Continue to eat heart healthy diet (full of fruits, vegetables, whole grains, lean protein, water--limit salt, fat, and sugar intake) and increase physical activity as tolerated.   I have personally reviewed and noted the following in the patient's chart:   . Medical and social history . Use of alcohol, tobacco or illicit drugs  . Current medications and supplements . Functional ability and status . Nutritional  status . Physical activity . Advanced directives . List of other physicians . Vitals . Screenings to include cognitive, depression, and falls . Referrals and appointments  In addition, I have reviewed and discussed with patient certain preventive protocols, quality metrics, and best practice  recommendations. A written personalized care plan for preventive services as well as general preventive health recommendations were provided to patient.     Michiel Cowboy, RN  11/30/2018

## 2019-01-02 ENCOUNTER — Other Ambulatory Visit: Payer: Self-pay | Admitting: Internal Medicine

## 2019-08-01 DIAGNOSIS — D1801 Hemangioma of skin and subcutaneous tissue: Secondary | ICD-10-CM | POA: Diagnosis not present

## 2019-08-01 DIAGNOSIS — L821 Other seborrheic keratosis: Secondary | ICD-10-CM | POA: Diagnosis not present

## 2019-08-01 DIAGNOSIS — L578 Other skin changes due to chronic exposure to nonionizing radiation: Secondary | ICD-10-CM | POA: Diagnosis not present

## 2019-08-12 DIAGNOSIS — Z23 Encounter for immunization: Secondary | ICD-10-CM | POA: Diagnosis not present

## 2019-09-07 ENCOUNTER — Ambulatory Visit (INDEPENDENT_AMBULATORY_CARE_PROVIDER_SITE_OTHER): Payer: Medicare Other

## 2019-09-07 ENCOUNTER — Other Ambulatory Visit: Payer: Self-pay

## 2019-09-07 DIAGNOSIS — Z299 Encounter for prophylactic measures, unspecified: Secondary | ICD-10-CM

## 2019-09-07 DIAGNOSIS — Z23 Encounter for immunization: Secondary | ICD-10-CM | POA: Diagnosis not present

## 2019-10-14 DIAGNOSIS — Z7982 Long term (current) use of aspirin: Secondary | ICD-10-CM | POA: Diagnosis not present

## 2019-10-14 DIAGNOSIS — I10 Essential (primary) hypertension: Secondary | ICD-10-CM | POA: Diagnosis not present

## 2019-10-14 DIAGNOSIS — S68112A Complete traumatic metacarpophalangeal amputation of right middle finger, initial encounter: Secondary | ICD-10-CM | POA: Diagnosis not present

## 2019-10-16 DIAGNOSIS — S68112A Complete traumatic metacarpophalangeal amputation of right middle finger, initial encounter: Secondary | ICD-10-CM | POA: Diagnosis not present

## 2019-10-16 DIAGNOSIS — Z4800 Encounter for change or removal of nonsurgical wound dressing: Secondary | ICD-10-CM | POA: Diagnosis not present

## 2019-11-16 DIAGNOSIS — M79641 Pain in right hand: Secondary | ICD-10-CM | POA: Diagnosis not present

## 2019-12-05 ENCOUNTER — Encounter: Payer: Medicare Other | Admitting: Internal Medicine

## 2019-12-06 ENCOUNTER — Encounter: Payer: Self-pay | Admitting: Internal Medicine

## 2019-12-06 ENCOUNTER — Ambulatory Visit (INDEPENDENT_AMBULATORY_CARE_PROVIDER_SITE_OTHER): Payer: Medicare Other | Admitting: Internal Medicine

## 2019-12-06 ENCOUNTER — Other Ambulatory Visit: Payer: Self-pay

## 2019-12-06 VITALS — BP 126/84 | HR 79 | Temp 97.9°F | Ht 67.0 in | Wt 174.0 lb

## 2019-12-06 DIAGNOSIS — I10 Essential (primary) hypertension: Secondary | ICD-10-CM | POA: Diagnosis not present

## 2019-12-06 DIAGNOSIS — E291 Testicular hypofunction: Secondary | ICD-10-CM | POA: Diagnosis not present

## 2019-12-06 DIAGNOSIS — Z0000 Encounter for general adult medical examination without abnormal findings: Secondary | ICD-10-CM | POA: Diagnosis not present

## 2019-12-06 DIAGNOSIS — K219 Gastro-esophageal reflux disease without esophagitis: Secondary | ICD-10-CM

## 2019-12-06 LAB — COMPREHENSIVE METABOLIC PANEL
ALT: 21 U/L (ref 0–53)
AST: 26 U/L (ref 0–37)
Albumin: 4 g/dL (ref 3.5–5.2)
Alkaline Phosphatase: 67 U/L (ref 39–117)
BUN: 16 mg/dL (ref 6–23)
CO2: 28 mEq/L (ref 19–32)
Calcium: 9.1 mg/dL (ref 8.4–10.5)
Chloride: 104 mEq/L (ref 96–112)
Creatinine, Ser: 1.17 mg/dL (ref 0.40–1.50)
GFR: 60.4 mL/min (ref >60.00)
Glucose, Bld: 115 mg/dL — ABNORMAL HIGH (ref 70–99)
Potassium: 3.5 mEq/L (ref 3.5–5.1)
Sodium: 138 mEq/L (ref 135–145)
Total Bilirubin: 0.7 mg/dL (ref 0.2–1.2)
Total Protein: 6.3 g/dL (ref 6.0–8.3)

## 2019-12-06 LAB — CBC
HCT: 46.5 % (ref 39.0–52.0)
Hemoglobin: 15.8 g/dL (ref 13.0–17.0)
MCHC: 33.9 g/dL (ref 30.0–36.0)
MCV: 89 fl (ref 78.0–100.0)
Platelets: 207 10*3/uL (ref 150.0–400.0)
RBC: 5.22 Mil/uL (ref 4.22–5.81)
RDW: 13.5 % (ref 11.5–15.5)
WBC: 7.4 10*3/uL (ref 4.0–10.5)

## 2019-12-06 LAB — LIPID PANEL
Cholesterol: 139 mg/dL (ref 0–200)
HDL: 41.3 mg/dL (ref >39.00)
LDL Cholesterol: 72 mg/dL (ref 0–99)
NonHDL: 97.91
Total CHOL/HDL Ratio: 3
Triglycerides: 128 mg/dL (ref 0.0–149.0)
VLDL: 25.6 mg/dL (ref 0.0–40.0)

## 2019-12-06 NOTE — Patient Instructions (Signed)

## 2019-12-06 NOTE — Progress Notes (Signed)
Subjective:   Patient ID: Antonio Ferrell, male    DOB: 07-31-1941, 79 y.o.   MRN: [id number]  HPI Here for medicare wellness, no new complaints. Please see A/P for status and treatment of chronic medical problems.   HPI #2: Here for follow up blood pressure (BP controlled at home, takes hctz and no side effects, denies chest pains or headaches) and GERD (using omeprazole otc with good results, denies blood in stool or persistent GERD symptoms).   Diet: heart healthy Physical activity: active Depression/mood screen: negative Hearing: intact to whispered voice Visual acuity: grossly normal, performs annual eye exam  ADLs: capable Fall risk: none Home safety: good Cognitive evaluation: intact to orientation, naming, recall and repetition EOL planning: adv directives discussed    Office Visit from 12/06/2019 in [place] at [facility] Total Score  0      I have personally reviewed and have noted 1. The patient's medical and social history - reviewed today no changes 2. Their use of alcohol, tobacco or illicit drugs 3. Their current medications and supplements 4. The patient's functional ability including ADL's, fall risks, home safety risks and hearing or visual impairment. 5. Diet and physical activities 6. Evidence for depression or mood disorders 7. Care team reviewed and updated  Patient Care Team: Hoyt Koch, MD as PCP - General (Internal Medicine) Past Medical History:  Diagnosis Date  . GERD (gastroesophageal reflux disease)   . High blood pressure   . Nephrolithiasis   . Other dysphagia    Past Surgical History:  Procedure Laterality Date  . 24 hr Holter Monitor  02/24/2017   No worrisome arrhythmias. No symptoms noted.  Mostly NSR with some sinus bradycardia & tachycardia: Min HR 47 bpm; Max HR 122 bpm.  Monomorphic PVCs in singles, coupletes, trigeminy and bigeminy pattern - longest run of bigeminy ~14 beats.  Less frequent PACs with a  shor run of PAT - longest run ~11 beats, 155 bpm; Some PACs are aberrantly conducted  . Exercise Tolerance Test  02/11/2017   LOW RISK:  Bruce protocol 10:46 min, 12.9 METS. Excellent exercise tolerance. Peak HR 162 (110% Max Predicted). Hypertensive response - 159/100 mmHg --> 202/105 mmHg. PVCs noted with stress - but no increase in PVCs. No CP noted & no ischemic ST segment changes.  Marland Kitchen HERNIA REPAIR     right groin  . TONSILLECTOMY    . TRANSTHORACIC ECHOCARDIOGRAM  02/24/2017   Normal LV size and function. EF 55-60%. Normal wall motion. GR 1 DD. Mild MR. Otherwise normal   Family History  Problem Relation Age of Onset  . Lung cancer Mother   . Heart disease Father        CAD., Atrial fibrillation  . Heart attack Father 57       Multiple  . Alcohol abuse Father   . Stroke Father   . Colon cancer Paternal Grandfather   . Heart failure Maternal Grandmother   . Cancer Maternal Grandfather   . Heart attack Paternal Grandmother   . Atrial fibrillation Sister   . Coronary artery disease Sister   . Esophageal cancer Neg Hx   . Stomach cancer Neg Hx   . Rectal cancer Neg Hx   . Other Neg Hx        hypogonadism   Review of Systems  Constitutional: Negative.   HENT: Negative.   Eyes: Negative.   Respiratory: Negative for cough, chest tightness and shortness of breath.   Cardiovascular: Negative  for chest pain, palpitations and leg swelling.  Gastrointestinal: Negative for abdominal distention, abdominal pain, constipation, diarrhea, nausea and vomiting.  Musculoskeletal: Negative.   Skin: Negative.   Neurological: Negative.   Psychiatric/Behavioral: Negative.     Objective:  Physical Exam Constitutional:      Appearance: He is well-developed.  HENT:     Head: Normocephalic and atraumatic.  Cardiovascular:     Rate and Rhythm: Normal rate and regular rhythm.  Pulmonary:     Effort: Pulmonary effort is normal. No respiratory distress.     Breath sounds: Normal breath  sounds. No wheezing or rales.  Abdominal:     General: Bowel sounds are normal. There is no distension.     Palpations: Abdomen is soft.     Tenderness: There is no abdominal tenderness. There is no rebound.  Musculoskeletal:     Cervical back: Normal range of motion.  Skin:    General: Skin is warm and dry.  Neurological:     Mental Status: He is alert and oriented to person, place, and time.     Coordination: Coordination normal.     Vitals:   12/06/19 0940  BP: 126/84  Pulse: 79  Temp: 97.9 F (36.6 C)  TempSrc: Oral  SpO2: 97%  Weight: 174 lb (78.9 kg)  Height: 5\' 7"  (1.702 m)   This visit occurred during the SARS-CoV-2 public health emergency.  Safety protocols were in place, including screening questions prior to the visit, additional usage of staff PPE, and extensive cleaning of exam room while observing appropriate contact time as indicated for disinfecting solutions.   Assessment & Plan:

## 2019-12-07 NOTE — Assessment & Plan Note (Signed)
Flu shot up to date. Pneumonia complete. Shingrix complete. Tetanus due 2022. Colonoscopy due at 63 and we talked about cologuard at that time given his otherwise good health. Counseled about sun safety and mole surveillance. Counseled about the dangers of distracted driving. Given 10 year screening recommendations.

## 2019-12-07 NOTE — Assessment & Plan Note (Signed)
Doing well with otc omeprazole. 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

## 2019-12-07 NOTE — Assessment & Plan Note (Signed)
BP at goal on hctz. Checking CMP and adjust as needed.  

## 2019-12-19 DIAGNOSIS — M79641 Pain in right hand: Secondary | ICD-10-CM | POA: Diagnosis not present

## 2019-12-25 ENCOUNTER — Other Ambulatory Visit: Payer: Self-pay | Admitting: Internal Medicine

## 2020-02-08 DIAGNOSIS — M79641 Pain in right hand: Secondary | ICD-10-CM | POA: Diagnosis not present

## 2020-07-25 DIAGNOSIS — Z23 Encounter for immunization: Secondary | ICD-10-CM | POA: Diagnosis not present

## 2020-08-27 DIAGNOSIS — Z23 Encounter for immunization: Secondary | ICD-10-CM | POA: Diagnosis not present

## 2020-11-11 DIAGNOSIS — H25813 Combined forms of age-related cataract, bilateral: Secondary | ICD-10-CM | POA: Diagnosis not present

## 2020-11-11 DIAGNOSIS — H1045 Other chronic allergic conjunctivitis: Secondary | ICD-10-CM | POA: Diagnosis not present

## 2020-11-11 DIAGNOSIS — H02132 Senile ectropion of right lower eyelid: Secondary | ICD-10-CM | POA: Diagnosis not present

## 2020-12-04 DIAGNOSIS — H6122 Impacted cerumen, left ear: Secondary | ICD-10-CM | POA: Diagnosis not present

## 2020-12-09 ENCOUNTER — Other Ambulatory Visit: Payer: Self-pay

## 2020-12-09 ENCOUNTER — Encounter: Payer: Self-pay | Admitting: Internal Medicine

## 2020-12-09 ENCOUNTER — Ambulatory Visit (INDEPENDENT_AMBULATORY_CARE_PROVIDER_SITE_OTHER): Payer: Medicare Other | Admitting: Internal Medicine

## 2020-12-09 VITALS — BP 130/78 | HR 63 | Temp 98.2°F | Resp 18 | Ht 67.0 in | Wt 172.6 lb

## 2020-12-09 DIAGNOSIS — R739 Hyperglycemia, unspecified: Secondary | ICD-10-CM | POA: Diagnosis not present

## 2020-12-09 DIAGNOSIS — I10 Essential (primary) hypertension: Secondary | ICD-10-CM

## 2020-12-09 DIAGNOSIS — K219 Gastro-esophageal reflux disease without esophagitis: Secondary | ICD-10-CM

## 2020-12-09 DIAGNOSIS — N400 Enlarged prostate without lower urinary tract symptoms: Secondary | ICD-10-CM | POA: Diagnosis not present

## 2020-12-09 DIAGNOSIS — Z0000 Encounter for general adult medical examination without abnormal findings: Secondary | ICD-10-CM | POA: Diagnosis not present

## 2020-12-09 DIAGNOSIS — Z1159 Encounter for screening for other viral diseases: Secondary | ICD-10-CM | POA: Diagnosis not present

## 2020-12-09 LAB — COMPREHENSIVE METABOLIC PANEL
ALT: 13 U/L (ref 0–53)
AST: 20 U/L (ref 0–37)
Albumin: 4.1 g/dL (ref 3.5–5.2)
Alkaline Phosphatase: 74 U/L (ref 39–117)
BUN: 18 mg/dL (ref 6–23)
CO2: 29 mEq/L (ref 19–32)
Calcium: 9.4 mg/dL (ref 8.4–10.5)
Chloride: 103 mEq/L (ref 96–112)
Creatinine, Ser: 1.08 mg/dL (ref 0.40–1.50)
GFR: 65.8 mL/min (ref >60.00)
Glucose, Bld: 91 mg/dL (ref 70–99)
Potassium: 4.2 mEq/L (ref 3.5–5.1)
Sodium: 137 mEq/L (ref 135–145)
Total Bilirubin: 0.8 mg/dL (ref 0.2–1.2)
Total Protein: 6.2 g/dL (ref 6.0–8.3)

## 2020-12-09 LAB — HEMOGLOBIN A1C: Hgb A1c MFr Bld: 5.4 % (ref 4.6–6.5)

## 2020-12-09 LAB — LIPID PANEL
Cholesterol: 152 mg/dL (ref 0–200)
HDL: 44 mg/dL (ref >39.00)
LDL Cholesterol: 73 mg/dL (ref 0–99)
NonHDL: 107.79
Total CHOL/HDL Ratio: 3
Triglycerides: 175 mg/dL — ABNORMAL HIGH (ref 0.0–149.0)
VLDL: 35 mg/dL (ref 0.0–40.0)

## 2020-12-09 LAB — PSA: PSA: 1.55 ng/mL (ref 0.10–4.00)

## 2020-12-09 LAB — CBC
HCT: 47.6 % (ref 39.0–52.0)
Hemoglobin: 16.3 g/dL (ref 13.0–17.0)
MCHC: 34.3 g/dL (ref 30.0–36.0)
MCV: 87.8 fl (ref 78.0–100.0)
Platelets: 210 10*3/uL (ref 150.0–400.0)
RBC: 5.42 Mil/uL (ref 4.22–5.81)
RDW: 13.4 % (ref 11.5–15.5)
WBC: 7.7 10*3/uL (ref 4.0–10.5)

## 2020-12-09 NOTE — Patient Instructions (Addendum)
The only thing to ask about is the tetanus shot which is due December 2022 if you did not have one last year.  Health Maintenance, Male Adopting a healthy lifestyle and getting preventive care are important in promoting health and wellness. Ask your health care provider about:  The right schedule for you to have regular tests and exams.  Things you can do on your own to prevent diseases and keep yourself healthy. What should I know about diet, weight, and exercise? Eat a healthy diet  Eat a diet that includes plenty of vegetables, fruits, low-fat dairy products, and lean protein.  Do not eat a lot of foods that are high in solid fats, added sugars, or sodium.   Maintain a healthy weight Body mass index (BMI) is a measurement that can be used to identify possible weight problems. It estimates body fat based on height and weight. Your health care provider can help determine your BMI and help you achieve or maintain a healthy weight. Get regular exercise Get regular exercise. This is one of the most important things you can do for your health. Most adults should:  Exercise for at least 150 minutes each week. The exercise should increase your heart rate and make you sweat (moderate-intensity exercise).  Do strengthening exercises at least twice a week. This is in addition to the moderate-intensity exercise.  Spend less time sitting. Even light physical activity can be beneficial. Watch cholesterol and blood lipids Have your blood tested for lipids and cholesterol at 79 years of age, then have this test every 5 years. You may need to have your cholesterol levels checked more often if:  Your lipid or cholesterol levels are high.  You are older than 79 years of age.  You are at high risk for heart disease. What should I know about cancer screening? Many types of cancers can be detected early and may often be prevented. Depending on your health history and family history, you may need to  have cancer screening at various ages. This may include screening for:  Colorectal cancer.  Prostate cancer.  Skin cancer.  Lung cancer. What should I know about heart disease, diabetes, and high blood pressure? Blood pressure and heart disease  High blood pressure causes heart disease and increases the risk of stroke. This is more likely to develop in people who have high blood pressure readings, are of African descent, or are overweight.  Talk with your health care provider about your target blood pressure readings.  Have your blood pressure checked: ? Every 3-5 years if you are 4-85 years of age. ? Every year if you are 67 years old or older.  If you are between the ages of 23 and 57 and are a current or former smoker, ask your health care provider if you should have a one-time screening for abdominal aortic aneurysm (AAA). Diabetes Have regular diabetes screenings. This checks your fasting blood sugar level. Have the screening done:  Once every three years after age 49 if you are at a normal weight and have a low risk for diabetes.  More often and at a younger age if you are overweight or have a high risk for diabetes. What should I know about preventing infection? Hepatitis B If you have a higher risk for hepatitis B, you should be screened for this virus. Talk with your health care provider to find out if you are at risk for hepatitis B infection. Hepatitis C Blood testing is recommended for:  Everyone  born from [birth year] through 1964.  Anyone with known risk factors for hepatitis C. Sexually transmitted infections (STIs)  You should be screened each year for STIs, including gonorrhea and chlamydia, if: ? You are sexually active and are younger than 79 years of age. ? You are older than 79 years of age and your health care provider tells you that you are at risk for this type of infection. ? Your sexual activity has changed since you were last screened, and you are at  increased risk for chlamydia or gonorrhea. Ask your health care provider if you are at risk.  Ask your health care provider about whether you are at high risk for HIV. Your health care provider may recommend a prescription medicine to help prevent HIV infection. If you choose to take medicine to prevent HIV, you should first get tested for HIV. You should then be tested every 3 months for as long as you are taking the medicine. Follow these instructions at home: Lifestyle  Do not use any products that contain nicotine or tobacco, such as cigarettes, e-cigarettes, and chewing tobacco. If you need help quitting, ask your health care provider.  Do not use street drugs.  Do not share needles.  Ask your health care provider for help if you need support or information about quitting drugs. Alcohol use  Do not drink alcohol if your health care provider tells you not to drink.  If you drink alcohol: ? Limit how much you have to 0-2 drinks a day. ? Be aware of how much alcohol is in your drink. In the [place] one drink equals one 12 oz bottle of beer (355 mL), one 5 oz glass of wine (148 mL), or one 1 oz glass of hard liquor (44 mL). General instructions  Schedule regular health, dental, and eye exams.  Stay current with your vaccines.  Tell your health care provider if: ? You often feel depressed. ? You have ever been abused or do not feel safe at home. Summary  Adopting a healthy lifestyle and getting preventive care are important in promoting health and wellness.  Follow your health care provider's instructions about healthy diet, exercising, and getting tested or screened for diseases.  Follow your health care provider's instructions on monitoring your cholesterol and blood pressure. This information is not intended to replace advice given to you by your health care provider. Make sure you discuss any questions you have with your health care provider. Document Revised: 10/12/2018  Document Reviewed: 10/12/2018 Elsevier Patient Education  2021 [employer].

## 2020-12-09 NOTE — Progress Notes (Signed)
Subjective:   Patient ID: Antonio Ferrell, male    DOB: 1941/10/24, 80 y.o.   MRN: [id number]  HPI Here for medicare wellness, no new complaints. Please see A/P for status and treatment of chronic medical problems.   HPI #2: Here for follow up BP (taking hctz 25 mg daily, denies side effects, denies chest pains or headaches) and GERD (taking omeprazole 10 mg daily, denies breakthrough symptoms, has tried to stop with recurrence of symptoms around day 3, denies blood in stool).   Diet: heart healthy Physical activity: active Depression/mood screen: negative Hearing: intact to whispered voice Visual acuity: grossly normal, performs annual eye exam  ADLs: capable Fall risk: none Home safety: good Cognitive evaluation: intact to orientation, naming, recall and repetition EOL planning: adv directives discussed  [place] Visit from 12/09/2020 in [place] at [facility] Total Score 0      I have personally reviewed and have noted 1. The patient's medical and social history - reviewed today no changes 2. Their use of alcohol, tobacco or illicit drugs 3. Their current medications and supplements 4. The patient's functional ability including ADL's, fall risks, home safety risks and hearing or visual impairment. 5. Diet and physical activities 6. Evidence for depression or mood disorders 7. Care team reviewed and updated  Patient Care Team: Hoyt Koch, MD as PCP - General (Internal Medicine) Past Medical History:  Diagnosis Date  . GERD (gastroesophageal reflux disease)   . High blood pressure   . Nephrolithiasis   . Other dysphagia    Past Surgical History:  Procedure Laterality Date  . 24 hr Holter Monitor  02/24/2017   No worrisome arrhythmias. No symptoms noted.  Mostly NSR with some sinus bradycardia & tachycardia: Min HR 47 bpm; Max HR 122 bpm.  Monomorphic PVCs in singles, coupletes, trigeminy and bigeminy pattern - longest run of  bigeminy ~14 beats.  Less frequent PACs with a shor run of PAT - longest run ~11 beats, 155 bpm; Some PACs are aberrantly conducted  . Exercise Tolerance Test  02/11/2017   LOW RISK:  Bruce protocol 10:46 min, 12.9 METS. Excellent exercise tolerance. Peak HR 162 (110% Max Predicted). Hypertensive response - 159/100 mmHg --> 202/105 mmHg. PVCs noted with stress - but no increase in PVCs. No CP noted & no ischemic ST segment changes.  Marland Kitchen HERNIA REPAIR     right groin  . TONSILLECTOMY    . TRANSTHORACIC ECHOCARDIOGRAM  02/24/2017   Normal LV size and function. EF 55-60%. Normal wall motion. GR 1 DD. Mild MR. Otherwise normal   Family History  Problem Relation Age of Onset  . Lung cancer Mother   . Heart disease Father        CAD., Atrial fibrillation  . Heart attack Father 69       Multiple  . Alcohol abuse Father   . Stroke Father   . Colon cancer Paternal Grandfather   . Heart failure Maternal Grandmother   . Cancer Maternal Grandfather   . Heart attack Paternal Grandmother   . Atrial fibrillation Sister   . Coronary artery disease Sister   . Esophageal cancer Neg Hx   . Stomach cancer Neg Hx   . Rectal cancer Neg Hx   . Other Neg Hx        hypogonadism    Review of Systems  Constitutional: Negative.   HENT: Negative.   Eyes: Negative.   Respiratory: Negative for cough, chest tightness and shortness  of breath.   Cardiovascular: Negative for chest pain, palpitations and leg swelling.  Gastrointestinal: Negative for abdominal distention, abdominal pain, constipation, diarrhea, nausea and vomiting.  Musculoskeletal: Negative.   Skin: Negative.   Neurological: Negative.   Psychiatric/Behavioral: Negative.     Objective:  Physical Exam Constitutional:      Appearance: He is well-developed and well-nourished.  HENT:     Head: Normocephalic and atraumatic.  Eyes:     Extraocular Movements: EOM normal.  Cardiovascular:     Rate and Rhythm: Normal rate and regular rhythm.   Pulmonary:     Effort: Pulmonary effort is normal. No respiratory distress.     Breath sounds: Normal breath sounds. No wheezing or rales.  Abdominal:     General: Bowel sounds are normal. There is no distension.     Palpations: Abdomen is soft.     Tenderness: There is no abdominal tenderness. There is no rebound.  Musculoskeletal:        General: No edema.     Cervical back: Normal range of motion.  Skin:    General: Skin is warm and dry.  Neurological:     Mental Status: He is alert and oriented to person, place, and time.     Coordination: Coordination normal.  Psychiatric:        Mood and Affect: Mood and affect normal.     Vitals:   12/09/20 1048  BP: 130/78  Pulse: 63  Resp: 18  Temp: 98.2 F (36.8 C)  TempSrc: Oral  SpO2: 98%  Weight: 172 lb 9.6 oz (78.3 kg)  Height: 5\' 7"  (1.702 m)   This visit occurred during the SARS-CoV-2 public health emergency.  Safety protocols were in place, including screening questions prior to the visit, additional usage of staff PPE, and extensive cleaning of exam room while observing appropriate contact time as indicated for disinfecting solutions.   Assessment & Plan:

## 2020-12-09 NOTE — Assessment & Plan Note (Signed)
BP at goal on hctz 25 mg daily. Checking CMP and adjust as needed.

## 2020-12-09 NOTE — Assessment & Plan Note (Signed)
Taking omeprazole 10 mg daily and continue. Checking CBC and adjust as needed.

## 2020-12-09 NOTE — Assessment & Plan Note (Signed)
Flu shot up to date. Covid-19 up to date including booster. Pneumonia complete. Shingrix complete. Tetanus due 2022 December. Cologuard will be due May 2023. Counseled about sun safety and mole surveillance. Counseled about the dangers of distracted driving. Given 10 year screening recommendations.

## 2020-12-10 LAB — HEPATITIS C ANTIBODY
Hepatitis C Ab: NONREACTIVE
SIGNAL TO CUT-OFF: 0.09 (ref <1.00)

## 2020-12-26 ENCOUNTER — Other Ambulatory Visit: Payer: Self-pay | Admitting: Internal Medicine

## 2021-03-23 ENCOUNTER — Other Ambulatory Visit: Payer: Self-pay | Admitting: Internal Medicine

## 2021-05-20 DIAGNOSIS — Z23 Encounter for immunization: Secondary | ICD-10-CM | POA: Diagnosis not present

## 2021-06-16 ENCOUNTER — Other Ambulatory Visit: Payer: Self-pay | Admitting: Internal Medicine

## 2021-06-20 ENCOUNTER — Other Ambulatory Visit: Payer: Self-pay | Admitting: Internal Medicine

## 2021-06-20 ENCOUNTER — Telehealth: Payer: Self-pay | Admitting: Internal Medicine

## 2021-06-20 NOTE — Telephone Encounter (Signed)
1.Medication Requested: hydrochlorothiazide (HYDRODIURIL) 25 MG tablet 2. Pharmacy (Name, Street, [place]): [facility], [address] [phone] Phone:  [phone]  Fax:  [phone]     3. On Med List: y  41. Last Visit with PCP: 12/09/2020  5. Next visit date with PCP: n/a   Agent: Please be advised that RX refills may take up to 3 business days. We ask that you follow-up with your pharmacy.

## 2021-06-23 MED ORDER — HYDROCHLOROTHIAZIDE 25 MG PO TABS: 25.0000 mg | ORAL_TABLET | Freq: Every day | ORAL | 2 refills | Status: DC

## 2021-06-23 NOTE — Telephone Encounter (Signed)
Ok to refill since it wasn't originally prescribed by you. Please advise

## 2021-06-23 NOTE — Telephone Encounter (Signed)
Okay to refill #90 with 2 refills

## 2021-06-23 NOTE — Telephone Encounter (Signed)
Medication has been sent to the patient's pharmacy.  

## 2021-08-30 DIAGNOSIS — Z23 Encounter for immunization: Secondary | ICD-10-CM | POA: Diagnosis not present

## 2021-09-30 DIAGNOSIS — L820 Inflamed seborrheic keratosis: Secondary | ICD-10-CM | POA: Diagnosis not present

## 2021-09-30 DIAGNOSIS — L578 Other skin changes due to chronic exposure to nonionizing radiation: Secondary | ICD-10-CM | POA: Diagnosis not present

## 2021-09-30 DIAGNOSIS — L219 Seborrheic dermatitis, unspecified: Secondary | ICD-10-CM | POA: Diagnosis not present

## 2021-09-30 DIAGNOSIS — L821 Other seborrheic keratosis: Secondary | ICD-10-CM | POA: Diagnosis not present

## 2021-09-30 DIAGNOSIS — L570 Actinic keratosis: Secondary | ICD-10-CM | POA: Diagnosis not present

## 2021-10-13 DIAGNOSIS — H524 Presbyopia: Secondary | ICD-10-CM | POA: Diagnosis not present

## 2021-10-13 DIAGNOSIS — H25013 Cortical age-related cataract, bilateral: Secondary | ICD-10-CM | POA: Diagnosis not present

## 2021-10-13 DIAGNOSIS — H5213 Myopia, bilateral: Secondary | ICD-10-CM | POA: Diagnosis not present

## 2021-10-13 DIAGNOSIS — H2513 Age-related nuclear cataract, bilateral: Secondary | ICD-10-CM | POA: Diagnosis not present

## 2021-11-10 ENCOUNTER — Other Ambulatory Visit: Payer: Self-pay

## 2021-11-10 ENCOUNTER — Encounter: Payer: Self-pay | Admitting: Internal Medicine

## 2021-11-10 ENCOUNTER — Ambulatory Visit (INDEPENDENT_AMBULATORY_CARE_PROVIDER_SITE_OTHER): Payer: Medicare Other | Admitting: Internal Medicine

## 2021-11-10 VITALS — BP 122/80 | HR 57 | Resp 18 | Ht 67.0 in | Wt 172.8 lb

## 2021-11-10 DIAGNOSIS — E291 Testicular hypofunction: Secondary | ICD-10-CM | POA: Diagnosis not present

## 2021-11-10 DIAGNOSIS — K219 Gastro-esophageal reflux disease without esophagitis: Secondary | ICD-10-CM

## 2021-11-10 DIAGNOSIS — N400 Enlarged prostate without lower urinary tract symptoms: Secondary | ICD-10-CM | POA: Diagnosis not present

## 2021-11-10 DIAGNOSIS — I10 Essential (primary) hypertension: Secondary | ICD-10-CM

## 2021-11-10 DIAGNOSIS — R739 Hyperglycemia, unspecified: Secondary | ICD-10-CM

## 2021-11-10 LAB — LIPID PANEL
Cholesterol: 159 mg/dL (ref 0–200)
HDL: 44.5 mg/dL (ref >39.00)
LDL Cholesterol: 82 mg/dL (ref 0–99)
NonHDL: 114.17
Total CHOL/HDL Ratio: 4
Triglycerides: 160 mg/dL — ABNORMAL HIGH (ref 0.0–149.0)
VLDL: 32 mg/dL (ref 0.0–40.0)

## 2021-11-10 LAB — COMPREHENSIVE METABOLIC PANEL
ALT: 13 U/L (ref 0–53)
AST: 20 U/L (ref 0–37)
Albumin: 4.2 g/dL (ref 3.5–5.2)
Alkaline Phosphatase: 80 U/L (ref 39–117)
BUN: 17 mg/dL (ref 6–23)
CO2: 29 mEq/L (ref 19–32)
Calcium: 9.3 mg/dL (ref 8.4–10.5)
Chloride: 102 mEq/L (ref 96–112)
Creatinine, Ser: 1.13 mg/dL (ref 0.40–1.50)
GFR: 61.92 mL/min (ref >60.00)
Glucose, Bld: 86 mg/dL (ref 70–99)
Potassium: 3.9 mEq/L (ref 3.5–5.1)
Sodium: 139 mEq/L (ref 135–145)
Total Bilirubin: 0.7 mg/dL (ref 0.2–1.2)
Total Protein: 6.5 g/dL (ref 6.0–8.3)

## 2021-11-10 LAB — CBC
HCT: 48.5 % (ref 39.0–52.0)
Hemoglobin: 16.1 g/dL (ref 13.0–17.0)
MCHC: 33.1 g/dL (ref 30.0–36.0)
MCV: 87.3 fl (ref 78.0–100.0)
Platelets: 223 10*3/uL (ref 150.0–400.0)
RBC: 5.55 Mil/uL (ref 4.22–5.81)
RDW: 13.4 % (ref 11.5–15.5)
WBC: 9 10*3/uL (ref 4.0–10.5)

## 2021-11-10 LAB — HEMOGLOBIN A1C: Hgb A1c MFr Bld: 5.5 % (ref 4.6–6.5)

## 2021-11-10 LAB — PSA: PSA: 2.03 ng/mL (ref 0.10–4.00)

## 2021-11-10 NOTE — Progress Notes (Signed)
° °  Subjective:   Patient ID: Antonio Ferrell, male    DOB: 06/17/1941, 81 y.o.   MRN: [id number]  HPI The patient is a 81 YO man coming in for follow up.   Review of Systems  Constitutional: Negative.   HENT: Negative.    Eyes: Negative.   Respiratory:  Negative for cough, chest tightness and shortness of breath.   Cardiovascular:  Negative for chest pain, palpitations and leg swelling.  Gastrointestinal:  Negative for abdominal distention, abdominal pain, constipation, diarrhea, nausea and vomiting.  Musculoskeletal: Negative.   Skin: Negative.   Neurological: Negative.   Psychiatric/Behavioral: Negative.     Objective:  Physical Exam Constitutional:      Appearance: He is well-developed.  HENT:     Head: Normocephalic and atraumatic.  Cardiovascular:     Rate and Rhythm: Normal rate and regular rhythm.  Pulmonary:     Effort: Pulmonary effort is normal. No respiratory distress.     Breath sounds: Normal breath sounds. No wheezing or rales.  Abdominal:     General: Bowel sounds are normal. There is no distension.     Palpations: Abdomen is soft.     Tenderness: There is no abdominal tenderness. There is no rebound.  Musculoskeletal:     Cervical back: Normal range of motion.  Skin:    General: Skin is warm and dry.  Neurological:     Mental Status: He is alert and oriented to person, place, and time.     Coordination: Coordination normal.    Vitals:   11/10/21 1444  BP: 122/80  Pulse: (!) 57  Resp: 18  SpO2: 95%  Weight: 172 lb 12.8 oz (78.4 kg)  Height: 5\' 7"  (1.702 m)    This visit occurred during the SARS-CoV-2 public health emergency.  Safety protocols were in place, including screening questions prior to the visit, additional usage of staff PPE, and extensive cleaning of exam room while observing appropriate contact time as indicated for disinfecting solutions.   Assessment & Plan:

## 2021-11-10 NOTE — Patient Instructions (Addendum)
The tetanus vaccine you can get at the pharmacy.

## 2021-11-14 DIAGNOSIS — Z23 Encounter for immunization: Secondary | ICD-10-CM | POA: Diagnosis not present

## 2021-11-14 NOTE — Assessment & Plan Note (Signed)
Taking prilosec 10 mg daily and doing well overall. Will continue.

## 2021-11-14 NOTE — Assessment & Plan Note (Signed)
BP at goal on hctz 25 mg daily. Checking CMP and adjust as needed.

## 2021-11-14 NOTE — Assessment & Plan Note (Signed)
Checking PSA 

## 2021-11-25 DIAGNOSIS — H25811 Combined forms of age-related cataract, right eye: Secondary | ICD-10-CM | POA: Diagnosis not present

## 2021-11-25 DIAGNOSIS — H25011 Cortical age-related cataract, right eye: Secondary | ICD-10-CM | POA: Diagnosis not present

## 2021-11-25 DIAGNOSIS — H2511 Age-related nuclear cataract, right eye: Secondary | ICD-10-CM | POA: Diagnosis not present

## 2021-11-25 DIAGNOSIS — H52201 Unspecified astigmatism, right eye: Secondary | ICD-10-CM | POA: Diagnosis not present

## 2021-12-11 ENCOUNTER — Ambulatory Visit (INDEPENDENT_AMBULATORY_CARE_PROVIDER_SITE_OTHER): Payer: Medicare Other

## 2021-12-11 ENCOUNTER — Other Ambulatory Visit: Payer: Self-pay

## 2021-12-11 DIAGNOSIS — Z0000 Encounter for general adult medical examination without abnormal findings: Secondary | ICD-10-CM

## 2021-12-11 NOTE — Patient Instructions (Signed)
Antonio Ferrell , Thank you for taking time to come for your Medicare Wellness Visit. I appreciate your ongoing commitment to your health goals. Please review the following plan we discussed and let me know if I can assist you in the future.   Screening recommendations/referrals: Colonoscopy: Not a candidate for screening due to age Recommended yearly ophthalmology/optometry visit for glaucoma screening and checkup Recommended yearly dental visit for hygiene and checkup  Vaccinations: Influenza vaccine: 08/21/2021 Pneumococcal vaccine: 11/20/2014, 09/07/2019 Tdap vaccine: 10/19/2011; due every 10 years (overdue) Shingles vaccine: 02/19/2017, 05/21/2017   Covid-19: 11/09/2019, 12/07/2019, 08/27/2020  Advanced directives: Please bring a copy of your health care power of attorney and living will to the office at your convenience.  Conditions/risks identified: Yes; Client understands the importance of follow-up with providers by attending scheduled visits and discussed goals to eat healthier, increase physical activity, exercise the brain, socialize more, get enough sleep and make time for laughter.  Next appointment: Please schedule your next Medicare Wellness Visit with your Nurse Health Advisor in 1 year by calling [phone].  Preventive Care 80 Years and Older, Male Preventive care refers to lifestyle choices and visits with your health care provider that can promote health and wellness. What does preventive care include? A yearly physical exam. This is also called an annual well check. Dental exams once or twice a year. Routine eye exams. Ask your health care provider how often you should have your eyes checked. Personal lifestyle choices, including: Daily care of your teeth and gums. Regular physical activity. Eating a healthy diet. Avoiding tobacco and drug use. Limiting alcohol use. Practicing safe sex. Taking low doses of aspirin every day. Taking vitamin and mineral supplements as  recommended by your health care provider. What happens during an annual well check? The services and screenings done by your health care provider during your annual well check will depend on your age, overall health, lifestyle risk factors, and family history of disease. Counseling  Your health care provider may ask you questions about your: Alcohol use. Tobacco use. Drug use. Emotional well-being. Home and relationship well-being. Sexual activity. Eating habits. History of falls. Memory and ability to understand (cognition). Work and work Statistician. Screening  You may have the following tests or measurements: Height, weight, and BMI. Blood pressure. Lipid and cholesterol levels. These may be checked every 5 years, or more frequently if you are over 51 years old. Skin check. Lung cancer screening. You may have this screening every year starting at age 23 if you have a 30-pack-year history of smoking and currently smoke or have quit within the past 15 years. Fecal occult blood test (FOBT) of the stool. You may have this test every year starting at age 27. Flexible sigmoidoscopy or colonoscopy. You may have a sigmoidoscopy every 5 years or a colonoscopy every 10 years starting at age 11. Prostate cancer screening. Recommendations will vary depending on your family history and other risks. Hepatitis C blood test. Hepatitis B blood test. Sexually transmitted disease (STD) testing. Diabetes screening. This is done by checking your blood sugar (glucose) after you have not eaten for a while (fasting). You may have this done every 1-3 years. Abdominal aortic aneurysm (AAA) screening. You may need this if you are a current or former smoker. Osteoporosis. You may be screened starting at age 80 if you are at high risk. Talk with your health care provider about your test results, treatment options, and if necessary, the need for more tests. Vaccines  Your health  care provider may recommend  certain vaccines, such as: Influenza vaccine. This is recommended every year. Tetanus, diphtheria, and acellular pertussis (Tdap, Td) vaccine. You may need a Td booster every 10 years. Zoster vaccine. You may need this after age 61. Pneumococcal 13-valent conjugate (PCV13) vaccine. One dose is recommended after age 68. Pneumococcal polysaccharide (PPSV23) vaccine. One dose is recommended after age 30. Talk to your health care provider about which screenings and vaccines you need and how often you need them. This information is not intended to replace advice given to you by your health care provider. Make sure you discuss any questions you have with your health care provider. Document Released: 11/15/2015 Document Revised: 07/08/2016 Document Reviewed: 08/20/2015 Elsevier Interactive Patient Education  2017 [employer] Prevention in the Home Falls can cause injuries. They can happen to people of all ages. There are many things you can do to make your home safe and to help prevent falls. What can I do on the outside of my home? Regularly fix the edges of walkways and driveways and fix any cracks. Remove anything that might make you trip as you walk through a door, such as a raised step or threshold. Trim any bushes or trees on the path to your home. Use bright outdoor lighting. Clear any walking paths of anything that might make someone trip, such as rocks or tools. Regularly check to see if handrails are loose or broken. Make sure that both sides of any steps have handrails. Any raised decks and porches should have guardrails on the edges. Have any leaves, snow, or ice cleared regularly. Use sand or salt on walking paths during winter. Clean up any spills in your garage right away. This includes oil or grease spills. What can I do in the bathroom? Use night lights. Install grab bars by the toilet and in the tub and shower. Do not use towel bars as grab bars. Use non-skid mats or  decals in the tub or shower. If you need to sit down in the shower, use a plastic, non-slip stool. Keep the floor dry. Clean up any water that spills on the floor as soon as it happens. Remove soap buildup in the tub or shower regularly. Attach bath mats securely with double-sided non-slip rug tape. Do not have throw rugs and other things on the floor that can make you trip. What can I do in the bedroom? Use night lights. Make sure that you have a light by your bed that is easy to reach. Do not use any sheets or blankets that are too big for your bed. They should not hang down onto the floor. Have a firm chair that has side arms. You can use this for support while you get dressed. Do not have throw rugs and other things on the floor that can make you trip. What can I do in the kitchen? Clean up any spills right away. Avoid walking on wet floors. Keep items that you use a lot in easy-to-reach places. If you need to reach something above you, use a strong step stool that has a grab bar. Keep electrical cords out of the way. Do not use floor polish or wax that makes floors slippery. If you must use wax, use non-skid floor wax. Do not have throw rugs and other things on the floor that can make you trip. What can I do with my stairs? Do not leave any items on the stairs. Make sure that there are handrails on  both sides of the stairs and use them. Fix handrails that are broken or loose. Make sure that handrails are as long as the stairways. Check any carpeting to make sure that it is firmly attached to the stairs. Fix any carpet that is loose or worn. Avoid having throw rugs at the top or bottom of the stairs. If you do have throw rugs, attach them to the floor with carpet tape. Make sure that you have a light switch at the top of the stairs and the bottom of the stairs. If you do not have them, ask someone to add them for you. What else can I do to help prevent falls? Wear shoes that: Do not  have high heels. Have rubber bottoms. Are comfortable and fit you well. Are closed at the toe. Do not wear sandals. If you use a stepladder: Make sure that it is fully opened. Do not climb a closed stepladder. Make sure that both sides of the stepladder are locked into place. Ask someone to hold it for you, if possible. Clearly mark and make sure that you can see: Any grab bars or handrails. First and last steps. Where the edge of each step is. Use tools that help you move around (mobility aids) if they are needed. These include: Canes. Walkers. Scooters. Crutches. Turn on the lights when you go into a dark area. Replace any light bulbs as soon as they burn out. Set up your furniture so you have a clear path. Avoid moving your furniture around. If any of your floors are uneven, fix them. If there are any pets around you, be aware of where they are. Review your medicines with your doctor. Some medicines can make you feel dizzy. This can increase your chance of falling. Ask your doctor what other things that you can do to help prevent falls. This information is not intended to replace advice given to you by your health care provider. Make sure you discuss any questions you have with your health care provider. Document Released: 08/15/2009 Document Revised: 03/26/2016 Document Reviewed: 11/23/2014 Elsevier Interactive Patient Education  2017 [employer].

## 2021-12-11 NOTE — Progress Notes (Signed)
I connected with Biagio Borg today by telephone and verified that I am speaking with the correct person using two identifiers. Location patient: home Location provider: work Persons participating in the virtual visit: patient, provider.   I discussed the limitations, risks, security and privacy concerns of performing an evaluation and management service by telephone and the availability of in person appointments. I also discussed with the patient that there may be a patient responsible charge related to this service. The patient expressed understanding and verbally consented to this telephonic visit.    Interactive audio and video telecommunications were attempted between this provider and patient, however failed, due to patient having technical difficulties OR patient did not have access to video capability.  We continued and completed visit with audio only.  Some vital signs may be absent or patient reported.   Time Spent with patient on telephone encounter: 30 minutes  Subjective:   Antonio Ferrell is a 81 y.o. male who presents for Medicare Annual/Subsequent preventive examination.  Review of Systems     Cardiac Risk Factors include: advanced age (>76men, >25 women);male gender;hypertension;family history of premature cardiovascular disease     Objective:    There were no vitals filed for this visit. There is no height or weight on file to calculate BMI.  Advanced Directives 12/11/2021 11/30/2018 10/11/2015  Does Patient Have a Medical Advance Directive? Yes Yes Yes  Type of Advance Directive Living will;Healthcare Power of [place];Living will -  Does patient want to make changes to medical advance directive? No - Patient declined - -  Copy of [place] in Chart? No - copy requested No - copy requested Yes    Current Medications (verified) Outpatient Encounter Medications as of 12/11/2021  Medication Sig   aspirin 81 MG tablet Take  81 mg by mouth daily.   hydrochlorothiazide (HYDRODIURIL) 25 MG tablet Take 1 tablet (25 mg total) by mouth daily.   ibuprofen (ADVIL,MOTRIN) 200 MG tablet Take 400 mg by mouth every 6 (six) hours as needed.   omeprazole (PRILOSEC) 10 MG capsule Take 10 mg by mouth daily.   No facility-administered encounter medications on file as of 12/11/2021.    Allergies (verified) Patient has no known allergies.   History: Past Medical History:  Diagnosis Date   GERD (gastroesophageal reflux disease)    High blood pressure    Nephrolithiasis    Other dysphagia    Past Surgical History:  Procedure Laterality Date   24 hr Holter Monitor  02/24/2017   No worrisome arrhythmias. No symptoms noted.  Mostly NSR with some sinus bradycardia & tachycardia: Min HR 47 bpm; Max HR 122 bpm.  Monomorphic PVCs in singles, coupletes, trigeminy and bigeminy pattern - longest run of bigeminy ~14 beats.  Less frequent PACs with a shor run of PAT - longest run ~11 beats, 155 bpm; Some PACs are aberrantly conducted   Exercise Tolerance Test  02/11/2017   LOW RISK:  Bruce protocol 10:46 min, 12.9 METS. Excellent exercise tolerance. Peak HR 162 (110% Max Predicted). Hypertensive response - 159/100 mmHg --> 202/105 mmHg. PVCs noted with stress - but no increase in PVCs. No CP noted & no ischemic ST segment changes.   HERNIA REPAIR     right groin   TONSILLECTOMY     TRANSTHORACIC ECHOCARDIOGRAM  02/24/2017   Normal LV size and function. EF 55-60%. Normal wall motion. GR 1 DD. Mild MR. Otherwise normal   Family History  Problem Relation Age of  Onset   Lung cancer Mother    Heart disease Father        CAD., Atrial fibrillation   Heart attack Father 75       Multiple   Alcohol abuse Father    Stroke Father    Colon cancer Paternal Grandfather    Heart failure Maternal Grandmother    Cancer Maternal Grandfather    Heart attack Paternal Grandmother    Atrial fibrillation Sister    Coronary artery disease Sister     Esophageal cancer Neg Hx    Stomach cancer Neg Hx    Rectal cancer Neg Hx    Other Neg Hx        hypogonadism   Social History   Socioeconomic History   Marital status: Married    Spouse name: Not on file   Number of children: 0   Years of education: Not on file   Highest education level: Not on file  Occupational History   Occupation: retired  Tobacco Use   Smoking status: Never   Smokeless tobacco: Never  Vaping Use   Vaping Use: Never used  Substance and Sexual Activity   Alcohol use: No    Alcohol/week: 0.0 standard drinks    Comment: rare use   Drug use: No   Sexual activity: Yes  Other Topics Concern   Not on file  Social History Narrative   Undergraduate degree, Masters in Education   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 No children   Writer. Refurbished and restored a hypercub   Social Determinants of Health   Financial Resource Strain: Low Risk    Difficulty of Paying Living Expenses: Not hard at all  Food Insecurity: No Food Insecurity   Worried About Charity fundraiser in the Last Year: Never true   [place] in the Last Year: Never true  Transportation Needs: No Transportation Needs   Lack of Transportation (Medical): No   Lack of Transportation (Non-Medical): No  Physical Activity: Sufficiently Active   Days of Exercise per Week: 5 days   Minutes of Exercise per Session: 30 min  Stress: No Stress Concern Present   Feeling of Stress : Not at all  Social Connections: Socially Integrated   Frequency of Communication with Friends and Family: More than three times a week   Frequency of Social Gatherings with Friends and Family: More than three times a week   Attends Religious Services: More than 4 times per year   Active Member of [employer] or Organizations: Yes   Attends Programme researcher, broadcasting/film/video: More than 4 times per year   Marital Status: Married    Tobacco Counseling Counseling given: Not Answered   Clinical Intake:  Pre-visit preparation completed: Yes  Pain : No/denies pain     Nutritional Risks: None Diabetes: No  How often do you need to have someone help you when you read instructions, pamphlets, or other written materials from your doctor or pharmacy?: 1 - Never What is the last grade level you completed in school?: Master Degree  Diabetic? no  Interpreter Needed?: No  Information entered by :: Lisette Abu, LPN   Activities of Daily Living In your present state of health, do you have any difficulty performing the following activities: 12/11/2021  Hearing? N  Vision? N  Difficulty concentrating or making decisions? N  Walking or climbing stairs? N  Dressing or bathing? N  Doing errands, shopping? N  Preparing Food and eating ? N  Using the Toilet? N  In the past six months, have you accidently leaked urine? N  Do you have problems with loss of bowel control? N  Managing your Medications? N  Managing your Finances? N  Housekeeping or managing your Housekeeping? N  Some recent data might be hidden    Patient Care Team: Hoyt Koch, MD as PCP - General (Internal Medicine) Leslie Andrea Ezekiel Ina, MD as Consulting Physician (Ophthalmology)  Indicate any recent Medical Services you may have received from other than [facility] providers in the past year (date may be approximate).     Assessment:   This is a routine wellness examination for Cai.  Hearing/Vision screen Hearing Screening - Comments:: Patient denied any hearing difficulty.   No hearing aids.  Vision Screening - Comments:: Patient does not wear any corrective lenses/contacts.  Eye exam done by: Derry Lory, OD.  Dietary issues and exercise activities discussed: Current Exercise Habits: Home exercise routine, Type of exercise: walking, Time (Minutes): 30,  Frequency (Times/Week): 5, Weekly Exercise (Minutes/Week): 150, Intensity: Moderate, Exercise limited by: None identified   Goals Addressed               This Visit's Progress     DIET - INCREASE WATER INTAKE (pt-stated)        Drink more water.      Depression Screen PHQ 2/9 Scores 12/11/2021 12/09/2020 12/06/2019 11/30/2018 10/21/2017 10/15/2016 10/11/2015  PHQ - 2 Score 0 0 0 0 0 0 0    Fall Risk Fall Risk  12/11/2021 12/09/2020 12/06/2019 11/30/2018 10/21/2017  Falls in the past year? 0 0 0 0 No  Number falls in past yr: 0 0 - - -  Injury with Fall? 0 0 - - -  Risk for fall due to : No Fall Risks - - - -  Follow up Falls evaluation completed - - - -    FALL RISK PREVENTION PERTAINING TO THE HOME:  Any stairs in or around the home? Yes  If so, are there any without handrails? No  Home free of loose throw rugs in walkways, pet beds, electrical cords, etc? Yes  Adequate lighting in your home to reduce risk of falls? Yes   ASSISTIVE DEVICES UTILIZED TO PREVENT FALLS:  Life alert? No  Use of a cane, walker or w/c? No  Grab bars in the bathroom? No  Shower chair or bench in shower? No  Elevated toilet seat or a handicapped toilet? No   TIMED UP AND GO:  Was the test performed? No .  Length of time to ambulate 10 feet: n/a sec.   Gait steady and fast without use of assistive device  Cognitive Function: Normal cognitive status assessed by direct observation by this Nurse Health Advisor. No abnormalities found.          Immunizations Immunization History  Administered Date(s) Administered   Fluad Quad(high Dose 65+) 08/05/2020   Influenza Split 09/01/2011, 09/02/2017, 08/31/2018   Influenza, High Dose Seasonal PF 10/11/2015, 10/15/2016   Influenza,inj,Quad PF,6+ Mos 09/17/2014   Influenza-Unspecified 08/03/2019   Moderna Sars-Covid-2 Vaccination 11/09/2019, 12/07/2019, 08/27/2020   Pneumococcal Conjugate-13 09/07/2019   Pneumococcal Polysaccharide-23 11/20/2014   Tdap  10/19/2011   Zoster Recombinat (Shingrix) 02/19/2017, 05/21/2017   Zoster, Live 02/20/2008    TDAP status: Due, Education has been provided regarding  the importance of this vaccine. Advised may receive this vaccine at local pharmacy or Health Dept. Aware to provide a copy of the vaccination record if obtained from local pharmacy or Health Dept. Verbalized acceptance and understanding.  Flu Vaccine status: Up to date  Pneumococcal vaccine status: Up to date  Covid-19 vaccine status: Completed vaccines  Qualifies for Shingles Vaccine? Yes   Zostavax completed Yes   Shingrix Completed?: Yes  Screening Tests Health Maintenance  Topic Date Due   COVID-19 Vaccine (4 - Booster for Moderna series) 10/22/2020   TETANUS/TDAP  10/18/2021   Pneumonia Vaccine 54+ Years old  Completed   INFLUENZA VACCINE  Completed   Hepatitis C Screening  Completed   Zoster Vaccines- Shingrix  Completed   HPV VACCINES  Aged Out   COLONOSCOPY (Pts 45-42yrs Insurance coverage will need to be confirmed)  [place] Maintenance Due  Topic Date Due   COVID-19 Vaccine (4 - Booster for Moderna series) 10/22/2020   TETANUS/TDAP  10/18/2021    Colorectal cancer screening: No longer required.   Lung Cancer Screening: (Low Dose CT Chest recommended if Age 56-80 years, 30 pack-year currently smoking OR have quit w/in 15years.) does not qualify.   Lung Cancer Screening Referral: no  Additional Screening:  Hepatitis C Screening: does qualify; Completed yes  Vision Screening: Recommended annual ophthalmology exams for early detection of glaucoma and other disorders of the eye. Is the patient up to date with their annual eye exam?  Yes  Who is the provider or what is the name of the office in which the patient attends annual eye exams? Derry Lory, MD. If pt is not established with a provider, would they like to be referred to a provider to establish care? No .   Dental  Screening: Recommended annual dental exams for proper oral hygiene  Community Resource Referral / Chronic Care Management: CRR required this visit?  No   CCM required this visit?  No      Plan:     I have personally reviewed and noted the following in the patients chart:   Medical and social history Use of alcohol, tobacco or illicit drugs  Current medications and supplements including opioid prescriptions. Patient is not currently taking opioid prescriptions. Functional ability and status Nutritional status Physical activity Advanced directives List of other physicians Hospitalizations, surgeries, and ER visits in previous 12 months Vitals Screenings to include cognitive, depression, and falls Referrals and appointments  In addition, I have reviewed and discussed with patient certain preventive protocols, quality metrics, and best practice recommendations. A written personalized care plan for preventive services as well as general preventive health recommendations were provided to patient.     Sheral Flow, LPN   03/08/8468   Nurse Notes:  Patient is cogitatively intact. There were no vitals filed for this visit. There is no height or weight on file to calculate BMI.

## 2022-01-06 DIAGNOSIS — H2512 Age-related nuclear cataract, left eye: Secondary | ICD-10-CM | POA: Diagnosis not present

## 2022-01-06 DIAGNOSIS — H52202 Unspecified astigmatism, left eye: Secondary | ICD-10-CM | POA: Diagnosis not present

## 2022-01-06 DIAGNOSIS — H25812 Combined forms of age-related cataract, left eye: Secondary | ICD-10-CM | POA: Diagnosis not present

## 2022-01-06 DIAGNOSIS — H25012 Cortical age-related cataract, left eye: Secondary | ICD-10-CM | POA: Diagnosis not present

## 2022-02-23 DIAGNOSIS — D485 Neoplasm of uncertain behavior of skin: Secondary | ICD-10-CM | POA: Diagnosis not present

## 2022-02-23 DIAGNOSIS — L219 Seborrheic dermatitis, unspecified: Secondary | ICD-10-CM | POA: Diagnosis not present

## 2022-02-23 DIAGNOSIS — L570 Actinic keratosis: Secondary | ICD-10-CM | POA: Diagnosis not present

## 2022-02-23 DIAGNOSIS — D2371 Other benign neoplasm of skin of right lower limb, including hip: Secondary | ICD-10-CM | POA: Diagnosis not present

## 2022-02-23 DIAGNOSIS — L578 Other skin changes due to chronic exposure to nonionizing radiation: Secondary | ICD-10-CM | POA: Diagnosis not present

## 2022-02-23 DIAGNOSIS — D1801 Hemangioma of skin and subcutaneous tissue: Secondary | ICD-10-CM | POA: Diagnosis not present

## 2022-02-23 DIAGNOSIS — Z419 Encounter for procedure for purposes other than remedying health state, unspecified: Secondary | ICD-10-CM | POA: Diagnosis not present

## 2022-02-23 DIAGNOSIS — L821 Other seborrheic keratosis: Secondary | ICD-10-CM | POA: Diagnosis not present

## 2022-02-23 DIAGNOSIS — L820 Inflamed seborrheic keratosis: Secondary | ICD-10-CM | POA: Diagnosis not present

## 2022-04-02 ENCOUNTER — Other Ambulatory Visit: Payer: Self-pay | Admitting: Internal Medicine

## 2022-04-02 ENCOUNTER — Telehealth: Payer: Self-pay | Admitting: *Deleted

## 2022-04-02 NOTE — Telephone Encounter (Signed)
Called Antonio Ferrell and left message to call us back regarding upcoming PV and colonoscopy appts. Antonio Ferrell will need an OV with Dr Bryan Lemma prior to any procedure. Antonio Ferrell is 40 and has not been seen here since 2013 and no recall assessment sheet completed. Will cancel PV and colon appts now per protocol.

## 2022-04-15 ENCOUNTER — Encounter: Payer: Self-pay | Admitting: Gastroenterology

## 2022-04-29 ENCOUNTER — Encounter: Payer: Medicare Other | Admitting: Gastroenterology

## 2022-05-19 ENCOUNTER — Ambulatory Visit: Payer: Medicare Other | Admitting: Gastroenterology

## 2022-06-02 ENCOUNTER — Encounter: Payer: Self-pay | Admitting: Gastroenterology

## 2022-06-02 ENCOUNTER — Ambulatory Visit (INDEPENDENT_AMBULATORY_CARE_PROVIDER_SITE_OTHER): Payer: Medicare Other | Admitting: Gastroenterology

## 2022-06-02 VITALS — BP 138/74 | HR 65 | Ht 67.0 in | Wt 169.0 lb

## 2022-06-02 DIAGNOSIS — Z1211 Encounter for screening for malignant neoplasm of colon: Secondary | ICD-10-CM

## 2022-06-02 MED ORDER — NA SULFATE-K SULFATE-MG SULF 17.5-3.13-1.6 GM/177ML PO SOLN: ORAL | 0 refills | Status: DC

## 2022-06-02 MED ORDER — ONDANSETRON HCL 4 MG PO TABS: 4.0000 mg | ORAL_TABLET | Freq: Three times a day (TID) | ORAL | 0 refills | Status: DC | PRN

## 2022-06-02 NOTE — Patient Instructions (Addendum)
You have been scheduled for a colonoscopy. Please follow written instructions given to you at your visit today.  Please pick up your prep supplies at the pharmacy within the next 1-3 days. If you use inhalers (even only as needed), please bring them with you on the day of your procedure.   We have sent the following medications to your pharmacy for you to pick up at your convenience:   SUPREP  Zofran   Use 1  Zofran if needed 30 minutes prior to the start of the colon prep and  the night before and the day of. 2 are extra as needed  Due to recent changes in healthcare laws, you may see the results of your imaging and laboratory studies on MyChart before your provider has had a chance to review them.  We understand that in some cases there may be results that are confusing or concerning to you. Not all laboratory results come back in the same time frame and the provider may be waiting for multiple results in order to interpret others.  Please give [place] 48 hours in order for your provider to thoroughly review all the results before contacting the office for clarification of your results.    If you are age 85 or older, your body mass index should be between 23-30. Your Body mass index is 26.47 kg/m. If this is out of the aforementioned range listed, please consider follow up with your Primary Care Provider.  If you are age 63 or younger, your body mass index should be between 19-25. Your Body mass index is 26.47 kg/m. If this is out of the aformentioned range listed, please consider follow up with your Primary Care Provider.   ________________________________________________________  The [facility] providers would like to encourage you to use [facility] to communicate with providers for non-urgent requests or questions.  Due to long hold times on the telephone, sending your provider a message by [facility] may be a faster and more efficient way to get a response.  Please allow 48 business hours for a  response.  Please remember that this is for non-urgent requests.  _______________________________________________________   I appreciate the  opportunity to care for you  Thank You   Harl Bowie , MD

## 2022-06-02 NOTE — Progress Notes (Signed)
Antonio Ferrell    [id number]    Aug 27, 1941  Primary Care Physician:Crawford, Real Cons, MD  Referring Physician: Hoyt Koch, MD [address] [place],  [place] [address]   Chief complaint:  colon cancer screening  HPI:  81 year old very pleasant gentleman here to discuss colorectal cancer screening.  Overall he is active, is much younger appearing than his stated age. Denies any nausea, vomiting, abdominal pain, melena or bright red blood per rectum No family history of GI malignancy  Colonoscopy Mar 24, 2012 by Dr. Olevia Perches: Diverticulosis and hemorrhoids otherwise unremarkable exam   Outpatient Encounter Medications as of 06/02/2022  Medication Sig   aspirin 81 MG tablet Take 81 mg by mouth daily.   hydrochlorothiazide (HYDRODIURIL) 25 MG tablet Take 1 tablet by mouth once daily   ibuprofen (ADVIL,MOTRIN) 200 MG tablet Take 400 mg by mouth every 6 (six) hours as needed.   omeprazole (PRILOSEC) 10 MG capsule Take 10 mg by mouth daily.   No facility-administered encounter medications on file as of 06/02/2022.    Allergies as of 06/02/2022   (No Known Allergies)    Past Medical History:  Diagnosis Date   GERD (gastroesophageal reflux disease)    High blood pressure    Nephrolithiasis    Other dysphagia     Past Surgical History:  Procedure Laterality Date   24 hr Holter Monitor  02/24/2017   No worrisome arrhythmias. No symptoms noted.  Mostly NSR with some sinus bradycardia & tachycardia: Min HR 47 bpm; Max HR 122 bpm.  Monomorphic PVCs in singles, coupletes, trigeminy and bigeminy pattern - longest run of bigeminy ~14 beats.  Less frequent PACs with a shor run of PAT - longest run ~11 beats, 155 bpm; Some PACs are aberrantly conducted   Exercise Tolerance Test  02/11/2017   LOW RISK:  Bruce protocol 10:46 min, 12.9 METS. Excellent exercise tolerance. Peak HR 162 (110% Max Predicted). Hypertensive response - 159/100 mmHg --> 202/105 mmHg. PVCs  noted with stress - but no increase in PVCs. No CP noted & no ischemic ST segment changes.   HERNIA REPAIR     right groin   TONSILLECTOMY     TRANSTHORACIC ECHOCARDIOGRAM  02/24/2017   Normal LV size and function. EF 55-60%. Normal wall motion. GR 1 DD. Mild MR. Otherwise normal    Family History  Problem Relation Age of Onset   Lung cancer Mother    Heart disease Father        CAD., Atrial fibrillation   Heart attack Father 38       Multiple   Alcohol abuse Father    Stroke Father    Colon cancer Paternal Grandfather    Heart failure Maternal Grandmother    Cancer Maternal Grandfather    Heart attack Paternal Grandmother    Atrial fibrillation Sister    Coronary artery disease Sister    Esophageal cancer Neg Hx    Stomach cancer Neg Hx    Rectal cancer Neg Hx    Other Neg Hx        hypogonadism    Social History   Socioeconomic History   Marital status: Married    Spouse name: Not on file   Number of children: 0   Years of education: Not on file   Highest education level: Not on file  Occupational History   Occupation: retired  Tobacco Use   Smoking status: Never   Smokeless tobacco:  Never  Vaping Use   Vaping Use: Never used  Substance and Sexual Activity   Alcohol use: No    Alcohol/week: 0.0 standard drinks of alcohol    Comment: rare use   Drug use: No   Sexual activity: Yes  Other Topics Concern   Not on file  Social History Narrative   Undergraduate degree, Masters in Education   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 No children   Writer. Refurbished and restored a hypercub   Social Determinants of Health   Financial Resource Strain: Low Risk  (12/11/2021)   Overall Financial Resource Strain (CARDIA)    Difficulty of Paying Living Expenses: Not hard at all  Food Insecurity: No  Food Insecurity (12/11/2021)   Hunger Vital Sign    Worried About Running Out of Food in the Last Year: Never true    Ran Out of Food in the Last Year: Never true  Transportation Needs: No Transportation Needs (12/11/2021)   PRAPARE - Hydrologist (Medical): No    Lack of Transportation (Non-Medical): No  Physical Activity: Sufficiently Active (12/11/2021)   Exercise Vital Sign    Days of Exercise per Week: 5 days    Minutes of Exercise per Session: 30 min  Stress: No Stress Concern Present (12/11/2021)   [place]    Feeling of Stress : Not at all  Social Connections: [place] (12/11/2021)   Social Connection and Isolation Panel [NHANES]    Frequency of Communication with Friends and Family: More than three times a week    Frequency of Social Gatherings with Friends and Family: More than three times a week    Attends Religious Services: More than 4 times per year    Active Member of [employer] or Organizations: Yes    Attends Music therapist: More than 4 times per year    Marital Status: Married  Human resources officer Violence: Not At Risk (12/11/2021)   Humiliation, Afraid, Rape, and Kick questionnaire    Fear of Current or Ex-Partner: No    Emotionally Abused: No    Physically Abused: No    Sexually Abused: No      Review of systems: All other review of systems negative except as mentioned in the HPI.   Physical Exam: Vitals:   06/02/22 1017  BP: 138/74  Pulse: 65   Body mass index is 26.47 kg/m. Gen:      No acute distress HEENT:  sclera anicteric Abd:      soft, non-tender; no palpable masses, no distension Ext:    No edema Neuro: alert and oriented x 3 Psych: normal mood and affect  Data Reviewed:  Reviewed labs, radiology imaging, old records and pertinent past GI work up   Assessment and Plan/Recommendations:  81 year old very pleasant gentleman here to  discuss colonoscopy for colorectal cancer screening, he is average risk.  He is overall very active and much younger appearing than his stated age. Discussed in detail the potential risks and complications associated with colonoscopy and anesthesia as well as alternatives, patient understands and would like to proceed with the colonoscopy procedure.  This visit required >30 minutes of patient care (this includes precharting, chart review, review of results, face-to-face time used for counseling as well as  treatment plan and follow-up. The patient was provided an opportunity to ask questions and all were answered. The patient agreed with the plan and demonstrated an understanding of the instructions.  Damaris Hippo , MD    CC: Hoyt Koch, *

## 2022-06-08 ENCOUNTER — Encounter: Payer: Self-pay | Admitting: Gastroenterology

## 2022-07-20 ENCOUNTER — Ambulatory Visit (AMBULATORY_SURGERY_CENTER): Payer: Medicare Other | Admitting: Gastroenterology

## 2022-07-20 ENCOUNTER — Encounter: Payer: Self-pay | Admitting: Gastroenterology

## 2022-07-20 VITALS — BP 115/82 | HR 78 | Temp 97.5°F | Resp 11 | Ht 67.0 in | Wt 169.0 lb

## 2022-07-20 DIAGNOSIS — D122 Benign neoplasm of ascending colon: Secondary | ICD-10-CM

## 2022-07-20 DIAGNOSIS — Z1211 Encounter for screening for malignant neoplasm of colon: Secondary | ICD-10-CM | POA: Diagnosis not present

## 2022-07-20 MED ORDER — SODIUM CHLORIDE 0.9 % IV SOLN: 500.0000 mL | Freq: Once | INTRAVENOUS | Status: DC

## 2022-07-20 NOTE — Patient Instructions (Signed)
Handouts provided about hemorrhoids, diverticulosis and polyps.  Resume previous diet.  Continue present medications.    NO ASPIRIN, ASPIRIN CONTAINING PRODUCTS (BC OR GOODY POWDERS) OR NSAIDS (IBUPROFEN, ADVIL, ALEVE, AND MOTRIN) FOR 2 weeks.    Await pathology results.  No repeat colonoscopy due to age.  Return to GI clinic as needed.    YOU HAD AN ENDOSCOPIC PROCEDURE TODAY AT [facility]:   Refer to the procedure report that was given to you for any specific questions about what was found during the examination.  If the procedure report does not answer your questions, please call your gastroenterologist to clarify.  If you requested that your care partner not be given the details of your procedure findings, then the procedure report has been included in a sealed envelope for you to review at your convenience later.  YOU SHOULD EXPECT: Some feelings of bloating in the abdomen. Passage of more gas than usual.  Walking can help get rid of the air that was put into your GI tract during the procedure and reduce the bloating. If you had a lower endoscopy (such as a colonoscopy or flexible sigmoidoscopy) you may notice spotting of blood in your stool or on the toilet paper. If you underwent a bowel prep for your procedure, you may not have a normal bowel movement for a few days.  Please Note:  You might notice some irritation and congestion in your nose or some drainage.  This is from the oxygen used during your procedure.  There is no need for concern and it should clear up in a day or so.  SYMPTOMS TO REPORT IMMEDIATELY:  Following lower endoscopy (colonoscopy or flexible sigmoidoscopy):  Excessive amounts of blood in the stool  Significant tenderness or worsening of abdominal pains  Swelling of the abdomen that is new, acute  Fever of 100F or higher   For urgent or emergent issues, a gastroenterologist can be reached at any hour by calling [phone]. Do not use MyChart  messaging for urgent concerns.    DIET:  We do recommend a small meal at first, but then you may proceed to your regular diet.  Drink plenty of fluids but you should avoid alcoholic beverages for 24 hours.  ACTIVITY:  You should plan to take it easy for the rest of today and you should NOT DRIVE or use heavy machinery until tomorrow (because of the sedation medicines used during the test).    FOLLOW UP: Our staff will call the number listed on your records the next business day following your procedure.  We will call around 7:15- 8:00 am to check on you and address any questions or concerns that you may have regarding the information given to you following your procedure. If we do not reach you, we will leave a message.     If any biopsies were taken you will be contacted by phone or by letter within the next 1-3 weeks.  Please call us at [phone] if you have not heard about the biopsies in 3 weeks.    SIGNATURES/CONFIDENTIALITY: You and/or your care partner have signed paperwork which will be entered into your electronic medical record.  These signatures attest to the fact that that the information above on your After Visit Summary has been reviewed and is understood.  Full responsibility of the confidentiality of this discharge information lies with you and/or your care-partner.

## 2022-07-20 NOTE — Progress Notes (Signed)
Called to room to assist during endoscopic procedure.  Patient ID and intended procedure confirmed with present staff. Received instructions for my participation in the procedure from the performing physician.  

## 2022-07-20 NOTE — Progress Notes (Signed)
BP rechecked in pre op, 148/89,

## 2022-07-20 NOTE — Progress Notes (Signed)
VSS, transported to PACU °

## 2022-07-20 NOTE — Progress Notes (Signed)
Pt's states no medical or surgical changes since previsit or office visit. 

## 2022-07-20 NOTE — Op Note (Signed)
[facility] Patient Name: Antonio Ferrell Procedure Date: 07/20/2022 2:15 PM MRN: [id number] Endoscopist: Mauri Pole , MD Age: 81 Referring MD:  Date of Birth: 12/24/1940 Gender: Male Account #: [id number] Procedure:                Colonoscopy Indications:              Screening for colorectal malignant neoplasm Medicines:                Monitored Anesthesia Care Procedure:                Pre-Anesthesia Assessment:                           - Prior to the procedure, a History and Physical                            was performed, and patient medications and                            allergies were reviewed. The patient's tolerance of                            previous anesthesia was also reviewed. The risks                            and benefits of the procedure and the sedation                            options and risks were discussed with the patient.                            All questions were answered, and informed consent                            was obtained. Prior Anticoagulants: The patient has                            taken no previous anticoagulant or antiplatelet                            agents. ASA Grade Assessment: II - A patient with                            mild systemic disease. After reviewing the risks                            and benefits, the patient was deemed in                            satisfactory condition to undergo the procedure.                           After obtaining informed consent, the colonoscope  was passed under direct vision. Throughout the                            procedure, the patient's blood pressure, pulse, and                            oxygen saturations were monitored continuously. The                            Olympus PCF-H190DL (#[id number]) Colonoscope was                            introduced through the anus and advanced to the the                            cecum,  identified by appendiceal orifice and                            ileocecal valve. The colonoscopy was performed                            without difficulty. The patient tolerated the                            procedure well. The quality of the bowel                            preparation was good. The ileocecal valve,                            appendiceal orifice, and rectum were photographed. Scope In: 2:21:55 PM Scope Out: 6:28:31 PM Scope Withdrawal Time: 0 hours 9 minutes 52 seconds  Total Procedure Duration: 0 hours 16 minutes 23 seconds  Findings:                 The perianal and digital rectal examinations were                            normal.                           Two sessile polyps were found in the ascending                            colon. The polyps were 3 to 11 mm in size. These                            polyps were removed with a cold snare. Resection                            and retrieval were complete.                           Scattered small and large-mouthed diverticula were  found in the sigmoid colon and descending colon.                            There was narrowing of the colon in association                            with the diverticular opening. There was evidence                            of diverticular spasm. Peri-diverticular erythema                            was seen. There was evidence of an impacted                            diverticulum.                           Non-bleeding external and internal hemorrhoids were                            found during retroflexion. The hemorrhoids were                            medium-sized. Complications:            No immediate complications. Estimated Blood Loss:     Estimated blood loss was minimal. Impression:               - Two 3 to 11 mm polyps in the ascending colon,                            removed with a cold snare. Resected and retrieved.                            - Severe diverticulosis in the sigmoid colon and in                            the descending colon. There was narrowing of the                            colon in association with the diverticular opening.                            There was evidence of diverticular spasm.                            Peri-diverticular erythema was seen. There was                            evidence of an impacted diverticulum.                           - Non-bleeding external and internal hemorrhoids. Recommendation:           -  Patient has a contact number available for                            emergencies. The signs and symptoms of potential                            delayed complications were discussed with the                            patient. Return to normal activities tomorrow.                            Written discharge instructions were provided to the                            patient.                           - Resume previous diet.                           - Continue present medications.                           - No high dose aspirin, ibuprofen, naproxen, or                            other non-steroidal anti-inflammatory drugs for 2                            weeks.                           - Await pathology results.                           - No repeat colonoscopy due to age.                           - Return to GI clinic PRN. Mauri Pole, MD 07/20/2022 2:48:11 PM This report has been signed electronically.

## 2022-07-20 NOTE — Progress Notes (Signed)
[facility] History and Physical   Primary Care Physician:  Hoyt Koch, MD   Reason for Procedure:  Colorectal cancer screening  Plan:    Screening colonoscopy with possible interventions as needed     HPI: Antonio Ferrell is a very pleasant 81 y.o. male here for screening colonoscopy. Denies any nausea, vomiting, abdominal pain, melena or bright red blood per rectum  The risks and benefits as well as alternatives of endoscopic procedure(s) have been discussed and reviewed. All questions answered. The patient agrees to proceed.    Past Medical History:  Diagnosis Date   GERD (gastroesophageal reflux disease)    High blood pressure    Nephrolithiasis    Other dysphagia     Past Surgical History:  Procedure Laterality Date   24 hr Holter Monitor  02/24/2017   No worrisome arrhythmias. No symptoms noted.  Mostly NSR with some sinus bradycardia & tachycardia: Min HR 47 bpm; Max HR 122 bpm.  Monomorphic PVCs in singles, coupletes, trigeminy and bigeminy pattern - longest run of bigeminy ~14 beats.  Less frequent PACs with a shor run of PAT - longest run ~11 beats, 155 bpm; Some PACs are aberrantly conducted   Exercise Tolerance Test  02/11/2017   LOW RISK:  Bruce protocol 10:46 min, 12.9 METS. Excellent exercise tolerance. Peak HR 162 (110% Max Predicted). Hypertensive response - 159/100 mmHg --> 202/105 mmHg. PVCs noted with stress - but no increase in PVCs. No CP noted & no ischemic ST segment changes.   HERNIA REPAIR     right groin   TONSILLECTOMY     TRANSTHORACIC ECHOCARDIOGRAM  02/24/2017   Normal LV size and function. EF 55-60%. Normal wall motion. GR 1 DD. Mild MR. Otherwise normal    Prior to Admission medications   Medication Sig Start Date End Date Taking? Authorizing Provider  aspirin 81 MG tablet Take 81 mg by mouth daily.   Yes [provider]  hydrochlorothiazide (HYDRODIURIL) 25 MG tablet Take 1 tablet by mouth once daily 04/03/22   Yes Hoyt Koch, MD  omeprazole (PRILOSEC) 10 MG capsule Take 10 mg by mouth daily.   Yes [provider]  ibuprofen (ADVIL,MOTRIN) 200 MG tablet Take 400 mg by mouth every 6 (six) hours as needed.    [provider]  ondansetron (ZOFRAN) 4 MG tablet Take 1 tablet (4 mg total) by mouth every 8 (eight) hours as needed for nausea or vomiting. To use as directed with colonoscopy prep 06/02/22   Mauri Pole, MD    Current Outpatient Medications  Medication Sig Dispense Refill   aspirin 81 MG tablet Take 81 mg by mouth daily.     hydrochlorothiazide (HYDRODIURIL) 25 MG tablet Take 1 tablet by mouth once daily 90 tablet 2   omeprazole (PRILOSEC) 10 MG capsule Take 10 mg by mouth daily.     ibuprofen (ADVIL,MOTRIN) 200 MG tablet Take 400 mg by mouth every 6 (six) hours as needed.     ondansetron (ZOFRAN) 4 MG tablet Take 1 tablet (4 mg total) by mouth every 8 (eight) hours as needed for nausea or vomiting. To use as directed with colonoscopy prep 4 tablet 0   Current Facility-Administered Medications  Medication Dose Route Frequency Provider Last Rate Last Admin   0.9 %  sodium chloride infusion  500 mL Intravenous Once Mauri Pole, MD        Allergies as of 07/20/2022   (No Known Allergies)    Family History  Problem Relation Age of Onset   Lung cancer Mother    Heart disease Father        CAD., Atrial fibrillation   Heart attack Father 38       Multiple   Alcohol abuse Father    Stroke Father    Colon cancer Paternal Grandfather    Heart failure Maternal Grandmother    Cancer Maternal Grandfather    Heart attack Paternal Grandmother    Atrial fibrillation Sister    Coronary artery disease Sister    Esophageal cancer Neg Hx    Stomach cancer Neg Hx    Rectal cancer Neg Hx    Other Neg Hx        hypogonadism    Social History   Socioeconomic History   Marital status: Married    Spouse name: Not on file   Number of children: 0    Years of education: Not on file   Highest education level: Not on file  Occupational History   Occupation: retired  Tobacco Use   Smoking status: Never   Smokeless tobacco: Never  Vaping Use   Vaping Use: Never used  Substance and Sexual Activity   Alcohol use: No    Alcohol/week: 0.0 standard drinks of alcohol    Comment: rare use   Drug use: No   Sexual activity: Yes  Other Topics Concern   Not on file  Social History Narrative   Undergraduate degree, Masters in Education   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 No children   Writer. Refurbished and restored a hypercub   Social Determinants of Health   Financial Resource Strain: Low Risk  (12/11/2021)   Overall Financial Resource Strain (CARDIA)    Difficulty of Paying Living Expenses: Not hard at all  Food Insecurity: No Food Insecurity (12/11/2021)   Hunger Vital Sign    Worried About Running Out of Food in the Last Year: Never true    Ran Out of Food in the Last Year: Never true  Transportation Needs: No Transportation Needs (12/11/2021)   PRAPARE - Hydrologist (Medical): No    Lack of Transportation (Non-Medical): No  Physical Activity: Sufficiently Active (12/11/2021)   Exercise Vital Sign    Days of Exercise per Week: 5 days    Minutes of Exercise per Session: 30 min  Stress: No Stress Concern Present (12/11/2021)   [place]    Feeling of Stress : Not at all  Social Connections: [place] (12/11/2021)   Social Connection and Isolation Panel [NHANES]    Frequency of Communication with Friends and Family: More than three times a week    Frequency of Social Gatherings with Friends and Family: More than three times a week    Attends Religious Services:  More than 4 times per year    Active Member of [employer] or Organizations: Yes    Attends Archivist Meetings: More than 4 times per year    Marital Status: Married  Human resources officer Violence: Not At Risk (12/11/2021)   Humiliation, Afraid, Rape, and Kick questionnaire    Fear of Current or Ex-Partner: No    Emotionally Abused: No    Physically Abused: No    Sexually Abused: No    Review  of Systems:  All other review of systems negative except as mentioned in the HPI.  Physical Exam: Vital signs in last 24 hours: BP (!) 168/108   Pulse 62   Temp (!) 97.5 F (36.4 C)   Ht '5\' 7"'$  (1.702 m)   Wt 169 lb (76.7 kg)   SpO2 98%   BMI 26.47 kg/m  General:   Alert, NAD Lungs:  Clear .   Heart:  Regular rate and rhythm Abdomen:  Soft, nontender and nondistended. Neuro/Psych:  Alert and cooperative. Normal mood and affect. A and O x 3  Reviewed labs, radiology imaging, old records and pertinent past GI work up  Patient is appropriate for planned procedure(s) and anesthesia in an ambulatory setting   K. Denzil Magnuson , MD [phone]

## 2022-07-21 ENCOUNTER — Telehealth: Payer: Self-pay | Admitting: *Deleted

## 2022-07-21 NOTE — Telephone Encounter (Signed)
No answer on  follow up call. Left message.   

## 2022-08-06 ENCOUNTER — Encounter: Payer: Self-pay | Admitting: Gastroenterology

## 2022-08-06 DIAGNOSIS — Z23 Encounter for immunization: Secondary | ICD-10-CM | POA: Diagnosis not present

## 2022-08-11 DIAGNOSIS — L820 Inflamed seborrheic keratosis: Secondary | ICD-10-CM | POA: Diagnosis not present

## 2022-08-11 DIAGNOSIS — L578 Other skin changes due to chronic exposure to nonionizing radiation: Secondary | ICD-10-CM | POA: Diagnosis not present

## 2022-08-11 DIAGNOSIS — L570 Actinic keratosis: Secondary | ICD-10-CM | POA: Diagnosis not present

## 2022-08-11 DIAGNOSIS — D1801 Hemangioma of skin and subcutaneous tissue: Secondary | ICD-10-CM | POA: Diagnosis not present

## 2022-08-11 DIAGNOSIS — L821 Other seborrheic keratosis: Secondary | ICD-10-CM | POA: Diagnosis not present

## 2022-09-10 DIAGNOSIS — J301 Allergic rhinitis due to pollen: Secondary | ICD-10-CM | POA: Diagnosis not present

## 2022-09-10 DIAGNOSIS — K219 Gastro-esophageal reflux disease without esophagitis: Secondary | ICD-10-CM | POA: Diagnosis not present

## 2022-09-10 DIAGNOSIS — I10 Essential (primary) hypertension: Secondary | ICD-10-CM | POA: Diagnosis not present

## 2022-09-10 DIAGNOSIS — J3089 Other allergic rhinitis: Secondary | ICD-10-CM | POA: Diagnosis not present

## 2022-09-10 DIAGNOSIS — J3081 Allergic rhinitis due to animal (cat) (dog) hair and dander: Secondary | ICD-10-CM | POA: Diagnosis not present

## 2022-09-17 DIAGNOSIS — Z23 Encounter for immunization: Secondary | ICD-10-CM | POA: Diagnosis not present

## 2022-11-13 ENCOUNTER — Encounter: Payer: Medicare Other | Admitting: Internal Medicine

## 2022-11-16 ENCOUNTER — Encounter: Payer: Medicare Other | Admitting: Internal Medicine

## 2022-11-30 ENCOUNTER — Ambulatory Visit (INDEPENDENT_AMBULATORY_CARE_PROVIDER_SITE_OTHER): Payer: Medicare Other | Admitting: Internal Medicine

## 2022-11-30 ENCOUNTER — Encounter: Payer: Self-pay | Admitting: Internal Medicine

## 2022-11-30 VITALS — BP 150/96 | HR 92 | Temp 98.2°F | Ht 67.0 in | Wt 170.0 lb

## 2022-11-30 DIAGNOSIS — K4091 Unilateral inguinal hernia, without obstruction or gangrene, recurrent: Secondary | ICD-10-CM | POA: Diagnosis not present

## 2022-11-30 DIAGNOSIS — R7301 Impaired fasting glucose: Secondary | ICD-10-CM

## 2022-11-30 DIAGNOSIS — N400 Enlarged prostate without lower urinary tract symptoms: Secondary | ICD-10-CM

## 2022-11-30 DIAGNOSIS — I10 Essential (primary) hypertension: Secondary | ICD-10-CM | POA: Diagnosis not present

## 2022-11-30 DIAGNOSIS — K219 Gastro-esophageal reflux disease without esophagitis: Secondary | ICD-10-CM

## 2022-11-30 DIAGNOSIS — E291 Testicular hypofunction: Secondary | ICD-10-CM | POA: Diagnosis not present

## 2022-11-30 LAB — COMPREHENSIVE METABOLIC PANEL
ALT: 13 U/L (ref 0–53)
AST: 22 U/L (ref 0–37)
Albumin: 4.1 g/dL (ref 3.5–5.2)
Alkaline Phosphatase: 82 U/L (ref 39–117)
BUN: 16 mg/dL (ref 6–23)
CO2: 30 mEq/L (ref 19–32)
Calcium: 9.1 mg/dL (ref 8.4–10.5)
Chloride: 102 mEq/L (ref 96–112)
Creatinine, Ser: 0.98 mg/dL (ref 0.40–1.50)
GFR: 72.92 mL/min (ref >60.00)
Glucose, Bld: 105 mg/dL — ABNORMAL HIGH (ref 70–99)
Potassium: 4 mEq/L (ref 3.5–5.1)
Sodium: 139 mEq/L (ref 135–145)
Total Bilirubin: 0.6 mg/dL (ref 0.2–1.2)
Total Protein: 6.5 g/dL (ref 6.0–8.3)

## 2022-11-30 LAB — LIPID PANEL
Cholesterol: 157 mg/dL (ref 0–200)
HDL: 39.8 mg/dL (ref >39.00)
NonHDL: 117.61
Total CHOL/HDL Ratio: 4
Triglycerides: 232 mg/dL — ABNORMAL HIGH (ref 0.0–149.0)
VLDL: 46.4 mg/dL — ABNORMAL HIGH (ref 0.0–40.0)

## 2022-11-30 LAB — HEMOGLOBIN A1C: Hgb A1c MFr Bld: 5.4 % (ref 4.6–6.5)

## 2022-11-30 LAB — PSA: PSA: 2.3 ng/mL (ref 0.10–4.00)

## 2022-11-30 LAB — LDL CHOLESTEROL, DIRECT: Direct LDL: 84 mg/dL

## 2022-11-30 NOTE — Progress Notes (Unsigned)
   Subjective:   Patient ID: Antonio Ferrell, male    DOB: [birth year], 82 y.o.   MRN: [id number]  HPI The patient is an 82 YO man coming in for follow up.  Review of Systems  Objective:  Physical Exam  Vitals:   11/30/22 1427 11/30/22 1432  BP: (!) 160/100 (!) 150/96  Pulse: 92   Temp: 98.2 F (36.8 C)   TempSrc: Oral   SpO2: 96%   Weight: 170 lb (77.1 kg)   Height: '5\' 7"'$  (1.702 m)     Assessment & Plan:  Hernia surgeon

## 2022-12-01 DIAGNOSIS — K4091 Unilateral inguinal hernia, without obstruction or gangrene, recurrent: Secondary | ICD-10-CM | POA: Insufficient documentation

## 2022-12-01 LAB — CBC
HCT: 46.2 % (ref 39.0–52.0)
Hemoglobin: 15.9 g/dL (ref 13.0–17.0)
MCHC: 34.4 g/dL (ref 30.0–36.0)
MCV: 87.4 fl (ref 78.0–100.0)
Platelets: 209 10*3/uL (ref 150.0–400.0)
RBC: 5.28 Mil/uL (ref 4.22–5.81)
RDW: 13.4 % (ref 11.5–15.5)
WBC: 7.9 10*3/uL (ref 4.0–10.5)

## 2022-12-01 NOTE — Assessment & Plan Note (Signed)
Checking CBC, CMP, lipid panel and adjust hctz 25 mg daily as needed. He has been taking cold medicine and this is likely cause of elevated reading today. He monitors at home and before this week running 120-130/80s. Adjust hctz 25 mg daily as needed.

## 2022-12-01 NOTE — Assessment & Plan Note (Signed)
Prior repair in first years of life. In last few months gets intermittent bulge without pain which reduces easily. Counseled about option to talk with surgeon about repair or wait until this is more consistent and he likely will like to talk with a surgeon.

## 2022-12-01 NOTE — Assessment & Plan Note (Signed)
Checking PSA and adjust as needed. Symptoms are stable.

## 2022-12-01 NOTE — Assessment & Plan Note (Signed)
Takes prilosec 10 mg daily and symptoms are controlled. Benefit of treatment outweighs risk. Checking CMP and adjust as needed.

## 2022-12-02 ENCOUNTER — Encounter: Payer: Self-pay | Admitting: Internal Medicine

## 2022-12-02 DIAGNOSIS — K4091 Unilateral inguinal hernia, without obstruction or gangrene, recurrent: Secondary | ICD-10-CM

## 2022-12-07 NOTE — Addendum Note (Signed)
Addended by: Pricilla Holm A on: 12/07/2022 12:13 PM   Modules accepted: Orders

## 2023-01-01 ENCOUNTER — Other Ambulatory Visit: Payer: Self-pay | Admitting: Internal Medicine

## 2023-01-15 ENCOUNTER — Other Ambulatory Visit: Payer: Self-pay | Admitting: Surgery

## 2023-01-15 DIAGNOSIS — K4091 Unilateral inguinal hernia, without obstruction or gangrene, recurrent: Secondary | ICD-10-CM | POA: Diagnosis not present

## 2023-01-19 ENCOUNTER — Telehealth (INDEPENDENT_AMBULATORY_CARE_PROVIDER_SITE_OTHER): Payer: Medicare Other | Admitting: Family Medicine

## 2023-01-19 DIAGNOSIS — Z0000 Encounter for general adult medical examination without abnormal findings: Secondary | ICD-10-CM | POA: Diagnosis not present

## 2023-01-19 NOTE — Progress Notes (Signed)
PATIENT CHECK-IN and HEALTH RISK ASSESSMENT QUESTIONNAIRE:  -completed by phone/video for upcoming Medicare Preventive Visit  Pre-Visit Check-in: 1)Vitals (height, wt, BP, etc) - record in vitals section for visit on day of visit 2)Review and Update Medications, Allergies PMH, Surgeries, Social history in Epic 3)Hospitalizations in the last year with date/reason? No  4)Review and Update Care Team (patient's specialists) in Epic 5) Complete PHQ9 in Epic  6) Complete Fall Screening in Epic 7)Review all Health Maintenance Due and order under PCP if not done.  Medicare Wellness Patient Questionnaire:  Answer theses question about your habits: Do you drink alcohol? Yes If yes, how many drinks do you have a day? May have a drink once or twice month Have you ever smoked?No    Quit date if applicable?  N/A How many packs a day do/did you smoke? N/A Do you use smokeless tobacco?No  Do you use an illicit drugs?No  Do you exercises? Yes   IF so, what type and how many days/minutes per week?gym  3 times per week.  45 to 50 mins - tread mill and then strength training on machines, feels balance is good Are you sexually active?-  N/A   Number of partners? Typical breakfast: egg, bacon, toast with jam, orange juice, coffee  Typical lunch: fruit, cheese, crackers with cheese Typical dinner: protein, vegetable, starch  Typical snacks:peanuts, ice cream   Beverages:  Pepsi, OJ,  Sweet tea, water, coffee with breakfast  Answer theses question about you: Can you perform most household chores?Yes  Do you find it hard to follow a conversation in a noisy room?No  Do you often ask people to speak up or repeat themselves?No  Do you feel that you have a problem with memory?No  Do you balance your checkbook and or bank acounts?Yes  Do you feel safe at home?Yes  Last dentist visit?Nov 2023 Do you need assistance with any of the following: Please note if so   Driving?-No  Feeding yourself?-No   Getting  from bed to chair?-No   Getting to the toilet?- No  Bathing or showering?- No  Dressing yourself?-No  Managing money-No  Climbing a flight of stairs-No  Preparing meals?-No     Do you have Advanced Directives in place (Living Will, Healthcare Power or Attorney)? Yes    Last eye Exam and location? [facility] one year ago in  2023   Do you currently use prescribed or non-prescribed narcotic or opioid pain medications? No   Do you have a history or close family history of breast, ovarian, tubal or peritoneal cancer or a family member with BRCA (breast cancer susceptibility 1 and 2) gene mutations?  Nurse/Assistant Credentials/time stamp:St    ----------------------------------------------------------------------------------------------------------------------------------------------------------------------------------------------------------------------    MEDICARE ANNUAL PREVENTIVE CARE VISIT WITH PROVIDER (Welcome to Medicare, initial annual wellness or annual wellness exam)  Virtual Visit via Phone Note  I connected with Antonio Ferrell on 01/19/23  by phone and verified that I am speaking with the correct person using two identifiers.  Location patient: home Location provider:work or home office Persons participating in the virtual visit: patient, provider  Concerns and/or follow up today: Saw surgeon recently about a small hernia - they decided to just watch it for now.    See HM section in Epic for other details of completed HM.    ROS: negative for report of fevers, unintentional weight loss, vision changes, vision loss, hearing loss or change, chest pain, sob, hemoptysis, melena, hematochezia, hematuria, , falls, bleeding or bruising, loc, thoughts  of suicide or self harm, memory loss  Patient-completed extensive health risk assessment - reviewed and discussed with the patient: See Health Risk Assessment completed with patient prior to the visit either  above or in recent phone note. This was reviewed in detailed with the patient today and appropriate recommendations, orders and referrals were placed as needed per Summary below and patient instructions.   Review of Medical History: -PMH, PSH, Family History and current specialty and care providers reviewed and updated and listed below   Patient Care Team: Hoyt Koch, MD as PCP - General (Internal Medicine) Leslie Andrea Ezekiel Ina, MD as Consulting Physician (Ophthalmology)   Past Medical History:  Diagnosis Date   GERD (gastroesophageal reflux disease)    High blood pressure    Nephrolithiasis    Other dysphagia     Past Surgical History:  Procedure Laterality Date   24 hr Holter Monitor  02/24/2017   No worrisome arrhythmias. No symptoms noted.  Mostly NSR with some sinus bradycardia & tachycardia: Min HR 47 bpm; Max HR 122 bpm.  Monomorphic PVCs in singles, coupletes, trigeminy and bigeminy pattern - longest run of bigeminy ~14 beats.  Less frequent PACs with a shor run of PAT - longest run ~11 beats, 155 bpm; Some PACs are aberrantly conducted   Exercise Tolerance Test  02/11/2017   LOW RISK:  Bruce protocol 10:46 min, 12.9 METS. Excellent exercise tolerance. Peak HR 162 (110% Max Predicted). Hypertensive response - 159/100 mmHg --> 202/105 mmHg. PVCs noted with stress - but no increase in PVCs. No CP noted & no ischemic ST segment changes.   HERNIA REPAIR     right groin   TONSILLECTOMY     TRANSTHORACIC ECHOCARDIOGRAM  02/24/2017   Normal LV size and function. EF 55-60%. Normal wall motion. GR 1 DD. Mild MR. Otherwise normal    Social History   Socioeconomic History   Marital status: Married    Spouse name: Not on file   Number of children: 0   Years of education: Not on file   Highest education level: Not on file  Occupational History   Occupation: retired  Tobacco Use   Smoking status: Never   Smokeless tobacco: Never  Vaping Use   Vaping Use: Never used   Substance and Sexual Activity   Alcohol use: No    Alcohol/week: 0.0 standard drinks of alcohol    Comment: rare use   Drug use: No   Sexual activity: Yes  Other Topics Concern   Not on file  Social History Narrative   Undergraduate degree, Masters in Education   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 No children   Writer. Refurbished and restored a hypercub   Social Determinants of Health   Financial Resource Strain: Low Risk  (12/11/2021)   Overall Financial Resource Strain (CARDIA)    Difficulty of Paying Living Expenses: Not hard at all  Food Insecurity: No Food Insecurity (12/11/2021)   Hunger Vital Sign    Worried About Running Out of Food in the Last Year: Never true    Ran Out of Food in the Last Year: Never true  Transportation Needs: No Transportation Needs (12/11/2021)   PRAPARE - Hydrologist (Medical): No    Lack of Transportation (Non-Medical): No  Physical Activity: Sufficiently Active (  12/11/2021)   Exercise Vital Sign    Days of Exercise per Week: 5 days    Minutes of Exercise per Session: 30 min  Stress: No Stress Concern Present (12/11/2021)   [place]    Feeling of Stress : Not at all  Social Connections: [place] (12/11/2021)   Social Connection and Isolation Panel [NHANES]    Frequency of Communication with Friends and Family: More than three times a week    Frequency of Social Gatherings with Friends and Family: More than three times a week    Attends Religious Services: More than 4 times per year    Active Member of [employer] or Organizations: Yes    Attends Music therapist: More than 4 times per year    Marital Status: Married  Human resources officer Violence: Not At Risk (12/11/2021)    Humiliation, Afraid, Rape, and Kick questionnaire    Fear of Current or Ex-Partner: No    Emotionally Abused: No    Physically Abused: No    Sexually Abused: No    Family History  Problem Relation Age of Onset   Lung cancer Mother    Heart disease Father        CAD., Atrial fibrillation   Heart attack Father 50       Multiple   Alcohol abuse Father    Stroke Father    Colon cancer Paternal Grandfather    Heart failure Maternal Grandmother    Cancer Maternal Grandfather    Heart attack Paternal Grandmother    Atrial fibrillation Sister    Coronary artery disease Sister    Esophageal cancer Neg Hx    Stomach cancer Neg Hx    Rectal cancer Neg Hx    Other Neg Hx        hypogonadism    Current Outpatient Medications on File Prior to Visit  Medication Sig Dispense Refill   aspirin 81 MG tablet Take 81 mg by mouth daily.     hydrochlorothiazide (HYDRODIURIL) 25 MG tablet Take 1 tablet by mouth once daily 90 tablet 0   ibuprofen (ADVIL,MOTRIN) 200 MG tablet Take 400 mg by mouth every 6 (six) hours as needed.     omeprazole (PRILOSEC) 10 MG capsule Take 10 mg by mouth daily.     No current facility-administered medications on file prior to visit.    No Known Allergies     Physical Exam There were no vitals filed for this visit. Estimated body mass index is 26.63 kg/m as calculated from the following:   Height as of 11/30/22: 5\' 7"  (1.702 m).   Weight as of 11/30/22: 170 lb (77.1 kg).  EKG (optional): deferred due to virtual visit  GENERAL: alert, oriented, no acute distress detected; full vision exam deferred due to pandemic and/or virtual encounter  PSYCH/NEURO: pleasant and cooperative, no obvious depression or anxiety, speech and thought processing grossly intact, Cognitive function grossly intact  Flowsheet Row Video Visit from 01/19/2023 in [place] at [facility]  PHQ-9 Total Score 1           01/19/2023   11:23 AM 01/19/2023   11:22  AM 12/11/2021   10:56 AM 12/09/2020   10:54 AM 12/06/2019    9:44 AM  Depression screen PHQ 2/9  Decreased Interest 0 0 0 0 0  Down, Depressed, Hopeless 0 0 0 0 0  PHQ - 2 Score 0 0 0 0 0  Altered sleeping 1  Tired, decreased energy 0      Change in appetite 0      Feeling bad or failure about yourself  0      Trouble concentrating 0      Moving slowly or fidgety/restless 0      Suicidal thoughts 0      PHQ-9 Score 1      Difficult doing work/chores Not difficult at all           12/06/2019    9:44 AM 12/09/2020   10:54 AM 12/11/2021   10:46 AM 01/15/2023    3:17 PM 01/19/2023   11:22 AM  Fall Risk  Falls in the past year? 0 0 0 0 0  Was there an injury with Fall?  0 0 0 0  Fall Risk Category Calculator  0 0 0   0 1  Fall Risk Category (Retired)  Low Low    (RETIRED) Patient Fall Risk Level   Low fall risk    Patient at Risk for Falls Due to   No Fall Risks    Fall risk Follow up   Falls evaluation completed       SUMMARY AND PLAN:  Encounter for Medicare annual wellness exam     Discussed applicable health maintenance/preventive health measures and advised and referred or ordered per patient preferences:  Health Maintenance  Topic Date Due   DTaP/Tdap/Td (2 - Td or Tdap) 10/18/2021 - he plans to get this as he did not realize was due   COVID-19 Vaccine (5 - 2023-24 season) 08/03/2023 (Originally 09/27/2022) - reports had latest booster in october   Medicare Annual Wellness (AWV)  01/19/2024   Pneumonia Vaccine 50+ Years old  Completed   INFLUENZA VACCINE  Completed   Zoster Vaccines- Shingrix  Completed   HPV VACCINES  Aged Out   COLONOSCOPY (Pts 45-65yrs Insurance coverage will need to be confirmed)  Discontinued     Education and counseling on the following was provided based on the above review of health and a plan/checklist for the patient, along with additional information discussed, was provided for the patient in the patient instructions : -Advised and  counseled on healthy lifestyle - including the importance of a healthy diet, regular physical activity, social connections and stress management. -Advised and counseled on a whole foods based healthy diet and regular exercise:  A summary of a healthy diet was provided in the Patient Instructions. Recommended continued regular exercise and congratulated on current exercise/fitness schedule. Discussed and provided guidelines in patient instructions. He is doing excellent.  -Advise yearly dental visits at minimum and regular eye exams   Follow up: see patient instructions   Patient Instructions  I really enjoyed getting to talk with you today! I am available on Tuesdays and Thursdays for virtual visits if you have any questions or concerns, or if I can be of any further assistance.   CHECKLIST FROM ANNUAL WELLNESS VISIT:  -Follow up (please call to schedule if not scheduled after visit):  -Inperson visit with your Primary Doctor office: at least once a year and as needed -yearly for annual wellness visit with primary care office if you wish  Here is a list of your preventive care/health maintenance measures and the plan for each if any are due:  Health Maintenance  Topic Date Due   DTaP/Tdap/Td (2 - Td or Tdap) 10/18/2021 - please schedule at the office or get at the pharmacy. If you get at the pharmacy please let us  know so that we can update.    COVID-19 Vaccine (5 - 2023-24 season) 08/03/2023 (Originally 09/27/2022)   Medicare Annual Wellness (AWV)  01/19/2024   Pneumonia Vaccine 68+ Years old  Completed   INFLUENZA VACCINE  Completed   Zoster Vaccines- Shingrix  Completed   HPV VACCINES  Aged Out   COLONOSCOPY (Pts 45-31yrs Insurance coverage will need to be confirmed)  Discontinued    -See a dentist at least yearly  -Get your eyes checked and then per your eye specialist's recommendations  -Other issues addressed today: -keep up the regular exercise!!! Love it!  -I have  included below further information regarding a healthy whole foods based diet, physical activity guidelines for adults, stress management and opportunities for social connections. I hope you find this information useful.   -----------------------------------------------------------------------------------------------------------------------------------------------------------------------------------------------------------------------------------------------------------  NUTRITION: -eat real food: lots of colorful vegetables (half the plate) and fruits -5-7 servings of vegetables and fruits per day (fresh or steamed is best), exp. 2 servings of vegetables with lunch and dinner and 2 servings of fruit per day. Berries and greens such as kale and collards are great choices.  -consume on a regular basis: whole grains (make sure first ingredient on label contains the word "whole"), fresh fruits, fish, nuts, seeds, healthy oils (such as olive oil, avocado oil, grape seed oil) -may eat small amounts of dairy and lean meat on occasion, but avoid processed meats such as ham, bacon, lunch meat, etc. -drink water -try to avoid fast food and pre-packaged foods, processed meat -most experts advise limiting sodium to < 2300mg  per day, should limit further is any chronic conditions such as high blood pressure, heart disease, diabetes, etc. The American Heart Association advised that < 1500mg  is is ideal -try to avoid foods that contain any ingredients with names you do not recognize  -try to avoid sugar/sweets (except for the natural sugar that occurs in fresh fruit) -try to avoid sweet drinks -try to avoid white rice, white bread, pasta (unless whole grain), white or yellow potatoes  EXERCISE GUIDELINES FOR ADULTS: -if you wish to increase your physical activity, do so gradually and with the approval of your doctor -STOP and seek medical care immediately if you have any chest pain, chest discomfort or trouble  breathing when starting or increasing exercise  -move and stretch your body, legs, feet and arms when sitting for long periods -Physical activity guidelines for optimal health in adults: -least 150 minutes per week of aerobic exercise (can talk, but not sing) once approved by your doctor, 20-30 minutes of sustained activity or two 10 minute episodes of sustained activity every day.  -resistance training at least 2 days per week if approved by your doctor -balance exercises 3+ days per week:   Stand somewhere where you have something sturdy to hold onto if you lose balance.    1) lift up on toes, start with 5x per day and work up to 20x   2) stand and lift on leg straight out to the side so that foot is a few inches of the floor, start with 5x each side and work up to 20x each side   3) stand on one foot, start with 5 seconds each side and work up to 20 seconds on each side  If you need ideas or help with getting more active:  -Silver sneakers https://tools.silversneakers.com  -Walk with a Doc: http://stephens-thompson.biz/  -try to include resistance (weight lifting/strength building) and balance exercises twice per week: or the following link for  ideas: ChessContest.fr  UpdateClothing.com.cy  STRESS MANAGEMENT: -can try meditating, or just sitting quietly with deep breathing while intentionally relaxing all parts of your body for 5 minutes daily -if you need further help with stress, anxiety or depression please follow up with your primary doctor or contact the wonderful folks at [place]: [place]: -options in [place] if you wish to engage in more social and exercise related activities:  -Silver sneakers https://tools.silversneakers.com  -Walk with a Doc: http://stephens-thompson.biz/  -Check out the [place] 50+ section on the [place] of  [employer] (hiking clubs, book clubs, cards and games, chess, exercise classes, aquatic classes and much more) - see the website for details: https://www.[place].gov/departments/parks-recreation/active-adults50  -YouTube has lots of exercise videos for different ages and abilities as well  -[place] (a variety of indoor and outdoor inperson activities for adults). [phone]. [address]  -Virtual Online Classes (a variety of topics): see seniorplanet.org or call [phone]  -consider volunteering at a school, hospice center, church, senior center or elsewhere           Lucretia Kern, DO

## 2023-01-19 NOTE — Patient Instructions (Addendum)
I really enjoyed getting to talk with you today! I am available on Tuesdays and Thursdays for virtual visits if you have any questions or concerns, or if I can be of any further assistance.   CHECKLIST FROM ANNUAL WELLNESS VISIT:  -Follow up (please call to schedule if not scheduled after visit):  -Inperson visit with your Primary Doctor office: at least once a year and as needed -yearly for annual wellness visit with primary care office if you wish  Here is a list of your preventive care/health maintenance measures and the plan for each if any are due:  Health Maintenance  Topic Date Due   DTaP/Tdap/Td (2 - Td or Tdap) 10/18/2021 - please schedule at the office or get at the pharmacy. If you get at the pharmacy please let us know so that we can update.    COVID-19 Vaccine (5 - 2023-24 season) 08/03/2023 (Originally 09/27/2022)   Medicare Annual Wellness (AWV)  01/19/2024   Pneumonia Vaccine 62+ Years old  Completed   INFLUENZA VACCINE  Completed   Zoster Vaccines- Shingrix  Completed   HPV VACCINES  Aged Out   COLONOSCOPY (Pts 45-71yrs Insurance coverage will need to be confirmed)  Discontinued    -See a dentist at least yearly  -Get your eyes checked and then per your eye specialist's recommendations  -Other issues addressed today: -keep up the regular exercise!!! Love it!  -I have included below further information regarding a healthy whole foods based diet, physical activity guidelines for adults, stress management and opportunities for social connections. I hope you find this information useful.   -----------------------------------------------------------------------------------------------------------------------------------------------------------------------------------------------------------------------------------------------------------  NUTRITION: -eat real food: lots of colorful vegetables (half the plate) and fruits -5-7 servings of vegetables and fruits per day  (fresh or steamed is best), exp. 2 servings of vegetables with lunch and dinner and 2 servings of fruit per day. Berries and greens such as kale and collards are great choices.  -consume on a regular basis: whole grains (make sure first ingredient on label contains the word "whole"), fresh fruits, fish, nuts, seeds, healthy oils (such as olive oil, avocado oil, grape seed oil) -may eat small amounts of dairy and lean meat on occasion, but avoid processed meats such as ham, bacon, lunch meat, etc. -drink water -try to avoid fast food and pre-packaged foods, processed meat -most experts advise limiting sodium to < 2300mg  per day, should limit further is any chronic conditions such as high blood pressure, heart disease, diabetes, etc. The American Heart Association advised that < 1500mg  is is ideal -try to avoid foods that contain any ingredients with names you do not recognize  -try to avoid sugar/sweets (except for the natural sugar that occurs in fresh fruit) -try to avoid sweet drinks -try to avoid white rice, white bread, pasta (unless whole grain), white or yellow potatoes  EXERCISE GUIDELINES FOR ADULTS: -if you wish to increase your physical activity, do so gradually and with the approval of your doctor -STOP and seek medical care immediately if you have any chest pain, chest discomfort or trouble breathing when starting or increasing exercise  -move and stretch your body, legs, feet and arms when sitting for long periods -Physical activity guidelines for optimal health in adults: -least 150 minutes per week of aerobic exercise (can talk, but not sing) once approved by your doctor, 20-30 minutes of sustained activity or two 10 minute episodes of sustained activity every day.  -resistance training at least 2 days per week if approved by your doctor -  balance exercises 3+ days per week:   Stand somewhere where you have something sturdy to hold onto if you lose balance.    1) lift up on toes,  start with 5x per day and work up to 20x   2) stand and lift on leg straight out to the side so that foot is a few inches of the floor, start with 5x each side and work up to 20x each side   3) stand on one foot, start with 5 seconds each side and work up to 20 seconds on each side  If you need ideas or help with getting more active:  -Silver sneakers https://tools.silversneakers.com  -Walk with a Doc: http://stephens-thompson.biz/  -try to include resistance (weight lifting/strength building) and balance exercises twice per week: or the following link for ideas: ChessContest.fr  UpdateClothing.com.cy  STRESS MANAGEMENT: -can try meditating, or just sitting quietly with deep breathing while intentionally relaxing all parts of your body for 5 minutes daily -if you need further help with stress, anxiety or depression please follow up with your primary doctor or contact the wonderful folks at [place]: [place]: -options in [place] if you wish to engage in more social and exercise related activities:  -Silver sneakers https://tools.silversneakers.com  -Walk with a Doc: http://stephens-thompson.biz/  -Check out the [place] 50+ section on the [place] of [employer] (hiking clubs, book clubs, cards and games, chess, exercise classes, aquatic classes and much more) - see the website for details: https://www.[place].gov/departments/parks-recreation/active-adults50  -YouTube has lots of exercise videos for different ages and abilities as well  -[place] (a variety of indoor and outdoor inperson activities for adults). [phone]. [address]  -Virtual Online Classes (a variety of topics): see seniorplanet.org or call ([phone]  -consider volunteering at a school, hospice center, church, senior center or  elsewhere

## 2023-03-09 DIAGNOSIS — M47816 Spondylosis without myelopathy or radiculopathy, lumbar region: Secondary | ICD-10-CM | POA: Diagnosis not present

## 2023-03-09 DIAGNOSIS — M545 Low back pain, unspecified: Secondary | ICD-10-CM | POA: Diagnosis not present

## 2023-03-09 DIAGNOSIS — M48062 Spinal stenosis, lumbar region with neurogenic claudication: Secondary | ICD-10-CM | POA: Diagnosis not present

## 2023-03-24 DIAGNOSIS — M48062 Spinal stenosis, lumbar region with neurogenic claudication: Secondary | ICD-10-CM | POA: Diagnosis not present

## 2023-03-30 DIAGNOSIS — M47816 Spondylosis without myelopathy or radiculopathy, lumbar region: Secondary | ICD-10-CM | POA: Diagnosis not present

## 2023-03-30 DIAGNOSIS — M4807 Spinal stenosis, lumbosacral region: Secondary | ICD-10-CM | POA: Diagnosis not present

## 2023-04-01 ENCOUNTER — Other Ambulatory Visit: Payer: Self-pay | Admitting: Internal Medicine

## 2023-04-05 DIAGNOSIS — M9904 Segmental and somatic dysfunction of sacral region: Secondary | ICD-10-CM | POA: Diagnosis not present

## 2023-04-05 DIAGNOSIS — S332XXA Dislocation of sacroiliac and sacrococcygeal joint, initial encounter: Secondary | ICD-10-CM | POA: Diagnosis not present

## 2023-04-05 DIAGNOSIS — M48062 Spinal stenosis, lumbar region with neurogenic claudication: Secondary | ICD-10-CM | POA: Diagnosis not present

## 2023-04-08 DIAGNOSIS — M9904 Segmental and somatic dysfunction of sacral region: Secondary | ICD-10-CM | POA: Diagnosis not present

## 2023-04-08 DIAGNOSIS — M48062 Spinal stenosis, lumbar region with neurogenic claudication: Secondary | ICD-10-CM | POA: Diagnosis not present

## 2023-04-08 DIAGNOSIS — S332XXA Dislocation of sacroiliac and sacrococcygeal joint, initial encounter: Secondary | ICD-10-CM | POA: Diagnosis not present

## 2023-04-13 DIAGNOSIS — S332XXA Dislocation of sacroiliac and sacrococcygeal joint, initial encounter: Secondary | ICD-10-CM | POA: Diagnosis not present

## 2023-04-13 DIAGNOSIS — M48062 Spinal stenosis, lumbar region with neurogenic claudication: Secondary | ICD-10-CM | POA: Diagnosis not present

## 2023-04-13 DIAGNOSIS — M9904 Segmental and somatic dysfunction of sacral region: Secondary | ICD-10-CM | POA: Diagnosis not present

## 2023-04-15 DIAGNOSIS — M9904 Segmental and somatic dysfunction of sacral region: Secondary | ICD-10-CM | POA: Diagnosis not present

## 2023-04-15 DIAGNOSIS — M48062 Spinal stenosis, lumbar region with neurogenic claudication: Secondary | ICD-10-CM | POA: Diagnosis not present

## 2023-04-15 DIAGNOSIS — S332XXA Dislocation of sacroiliac and sacrococcygeal joint, initial encounter: Secondary | ICD-10-CM | POA: Diagnosis not present

## 2023-04-27 DIAGNOSIS — M9904 Segmental and somatic dysfunction of sacral region: Secondary | ICD-10-CM | POA: Diagnosis not present

## 2023-04-27 DIAGNOSIS — S332XXA Dislocation of sacroiliac and sacrococcygeal joint, initial encounter: Secondary | ICD-10-CM | POA: Diagnosis not present

## 2023-04-27 DIAGNOSIS — M48062 Spinal stenosis, lumbar region with neurogenic claudication: Secondary | ICD-10-CM | POA: Diagnosis not present

## 2023-04-28 DIAGNOSIS — H5212 Myopia, left eye: Secondary | ICD-10-CM | POA: Diagnosis not present

## 2023-04-28 DIAGNOSIS — Z961 Presence of intraocular lens: Secondary | ICD-10-CM | POA: Diagnosis not present

## 2023-04-29 DIAGNOSIS — S332XXA Dislocation of sacroiliac and sacrococcygeal joint, initial encounter: Secondary | ICD-10-CM | POA: Diagnosis not present

## 2023-04-29 DIAGNOSIS — M9904 Segmental and somatic dysfunction of sacral region: Secondary | ICD-10-CM | POA: Diagnosis not present

## 2023-04-29 DIAGNOSIS — M48062 Spinal stenosis, lumbar region with neurogenic claudication: Secondary | ICD-10-CM | POA: Diagnosis not present

## 2023-05-03 DIAGNOSIS — S332XXA Dislocation of sacroiliac and sacrococcygeal joint, initial encounter: Secondary | ICD-10-CM | POA: Diagnosis not present

## 2023-05-03 DIAGNOSIS — M48062 Spinal stenosis, lumbar region with neurogenic claudication: Secondary | ICD-10-CM | POA: Diagnosis not present

## 2023-05-03 DIAGNOSIS — M9904 Segmental and somatic dysfunction of sacral region: Secondary | ICD-10-CM | POA: Diagnosis not present

## 2023-05-05 DIAGNOSIS — M9904 Segmental and somatic dysfunction of sacral region: Secondary | ICD-10-CM | POA: Diagnosis not present

## 2023-05-05 DIAGNOSIS — M48062 Spinal stenosis, lumbar region with neurogenic claudication: Secondary | ICD-10-CM | POA: Diagnosis not present

## 2023-05-05 DIAGNOSIS — S332XXA Dislocation of sacroiliac and sacrococcygeal joint, initial encounter: Secondary | ICD-10-CM | POA: Diagnosis not present

## 2023-05-11 DIAGNOSIS — M9904 Segmental and somatic dysfunction of sacral region: Secondary | ICD-10-CM | POA: Diagnosis not present

## 2023-05-11 DIAGNOSIS — S332XXA Dislocation of sacroiliac and sacrococcygeal joint, initial encounter: Secondary | ICD-10-CM | POA: Diagnosis not present

## 2023-05-11 DIAGNOSIS — M48062 Spinal stenosis, lumbar region with neurogenic claudication: Secondary | ICD-10-CM | POA: Diagnosis not present

## 2023-05-13 DIAGNOSIS — S332XXA Dislocation of sacroiliac and sacrococcygeal joint, initial encounter: Secondary | ICD-10-CM | POA: Diagnosis not present

## 2023-05-13 DIAGNOSIS — M9904 Segmental and somatic dysfunction of sacral region: Secondary | ICD-10-CM | POA: Diagnosis not present

## 2023-05-13 DIAGNOSIS — M48062 Spinal stenosis, lumbar region with neurogenic claudication: Secondary | ICD-10-CM | POA: Diagnosis not present

## 2023-06-01 DIAGNOSIS — M47816 Spondylosis without myelopathy or radiculopathy, lumbar region: Secondary | ICD-10-CM | POA: Diagnosis not present

## 2023-06-01 DIAGNOSIS — M545 Low back pain, unspecified: Secondary | ICD-10-CM | POA: Diagnosis not present

## 2023-06-01 DIAGNOSIS — M4807 Spinal stenosis, lumbosacral region: Secondary | ICD-10-CM | POA: Diagnosis not present

## 2023-06-04 DIAGNOSIS — M4807 Spinal stenosis, lumbosacral region: Secondary | ICD-10-CM | POA: Diagnosis not present

## 2023-06-04 DIAGNOSIS — M545 Low back pain, unspecified: Secondary | ICD-10-CM | POA: Diagnosis not present

## 2023-06-04 DIAGNOSIS — M47816 Spondylosis without myelopathy or radiculopathy, lumbar region: Secondary | ICD-10-CM | POA: Diagnosis not present

## 2023-06-16 DIAGNOSIS — M545 Low back pain, unspecified: Secondary | ICD-10-CM | POA: Diagnosis not present

## 2023-06-16 DIAGNOSIS — M47816 Spondylosis without myelopathy or radiculopathy, lumbar region: Secondary | ICD-10-CM | POA: Diagnosis not present

## 2023-06-16 DIAGNOSIS — M4807 Spinal stenosis, lumbosacral region: Secondary | ICD-10-CM | POA: Diagnosis not present

## 2023-06-29 DIAGNOSIS — M47816 Spondylosis without myelopathy or radiculopathy, lumbar region: Secondary | ICD-10-CM | POA: Diagnosis not present

## 2023-06-29 DIAGNOSIS — M4807 Spinal stenosis, lumbosacral region: Secondary | ICD-10-CM | POA: Diagnosis not present

## 2023-06-29 DIAGNOSIS — M545 Low back pain, unspecified: Secondary | ICD-10-CM | POA: Diagnosis not present

## 2023-07-01 DIAGNOSIS — M4807 Spinal stenosis, lumbosacral region: Secondary | ICD-10-CM | POA: Diagnosis not present

## 2023-07-01 DIAGNOSIS — M545 Low back pain, unspecified: Secondary | ICD-10-CM | POA: Diagnosis not present

## 2023-07-01 DIAGNOSIS — M47816 Spondylosis without myelopathy or radiculopathy, lumbar region: Secondary | ICD-10-CM | POA: Diagnosis not present

## 2023-07-03 DIAGNOSIS — Z23 Encounter for immunization: Secondary | ICD-10-CM | POA: Diagnosis not present

## 2023-07-08 DIAGNOSIS — M4807 Spinal stenosis, lumbosacral region: Secondary | ICD-10-CM | POA: Diagnosis not present

## 2023-07-08 DIAGNOSIS — M545 Low back pain, unspecified: Secondary | ICD-10-CM | POA: Diagnosis not present

## 2023-07-08 DIAGNOSIS — M47816 Spondylosis without myelopathy or radiculopathy, lumbar region: Secondary | ICD-10-CM | POA: Diagnosis not present

## 2023-07-13 DIAGNOSIS — M47816 Spondylosis without myelopathy or radiculopathy, lumbar region: Secondary | ICD-10-CM | POA: Diagnosis not present

## 2023-07-13 DIAGNOSIS — M4807 Spinal stenosis, lumbosacral region: Secondary | ICD-10-CM | POA: Diagnosis not present

## 2023-07-13 DIAGNOSIS — M545 Low back pain, unspecified: Secondary | ICD-10-CM | POA: Diagnosis not present

## 2023-07-16 DIAGNOSIS — M47816 Spondylosis without myelopathy or radiculopathy, lumbar region: Secondary | ICD-10-CM | POA: Diagnosis not present

## 2023-07-16 DIAGNOSIS — M4807 Spinal stenosis, lumbosacral region: Secondary | ICD-10-CM | POA: Diagnosis not present

## 2023-07-16 DIAGNOSIS — M545 Low back pain, unspecified: Secondary | ICD-10-CM | POA: Diagnosis not present

## 2023-07-20 DIAGNOSIS — M4807 Spinal stenosis, lumbosacral region: Secondary | ICD-10-CM | POA: Diagnosis not present

## 2023-07-20 DIAGNOSIS — M47816 Spondylosis without myelopathy or radiculopathy, lumbar region: Secondary | ICD-10-CM | POA: Diagnosis not present

## 2023-07-20 DIAGNOSIS — M545 Low back pain, unspecified: Secondary | ICD-10-CM | POA: Diagnosis not present

## 2023-07-22 DIAGNOSIS — M4807 Spinal stenosis, lumbosacral region: Secondary | ICD-10-CM | POA: Diagnosis not present

## 2023-07-22 DIAGNOSIS — M47816 Spondylosis without myelopathy or radiculopathy, lumbar region: Secondary | ICD-10-CM | POA: Diagnosis not present

## 2023-07-22 DIAGNOSIS — M545 Low back pain, unspecified: Secondary | ICD-10-CM | POA: Diagnosis not present

## 2023-07-27 DIAGNOSIS — M47816 Spondylosis without myelopathy or radiculopathy, lumbar region: Secondary | ICD-10-CM | POA: Diagnosis not present

## 2023-07-27 DIAGNOSIS — M545 Low back pain, unspecified: Secondary | ICD-10-CM | POA: Diagnosis not present

## 2023-07-27 DIAGNOSIS — M4807 Spinal stenosis, lumbosacral region: Secondary | ICD-10-CM | POA: Diagnosis not present

## 2023-07-29 DIAGNOSIS — M47816 Spondylosis without myelopathy or radiculopathy, lumbar region: Secondary | ICD-10-CM | POA: Diagnosis not present

## 2023-07-29 DIAGNOSIS — M545 Low back pain, unspecified: Secondary | ICD-10-CM | POA: Diagnosis not present

## 2023-07-29 DIAGNOSIS — M4807 Spinal stenosis, lumbosacral region: Secondary | ICD-10-CM | POA: Diagnosis not present

## 2023-08-03 DIAGNOSIS — M545 Low back pain, unspecified: Secondary | ICD-10-CM | POA: Diagnosis not present

## 2023-08-03 DIAGNOSIS — M4807 Spinal stenosis, lumbosacral region: Secondary | ICD-10-CM | POA: Diagnosis not present

## 2023-08-03 DIAGNOSIS — M47816 Spondylosis without myelopathy or radiculopathy, lumbar region: Secondary | ICD-10-CM | POA: Diagnosis not present

## 2023-08-05 DIAGNOSIS — M47816 Spondylosis without myelopathy or radiculopathy, lumbar region: Secondary | ICD-10-CM | POA: Diagnosis not present

## 2023-08-05 DIAGNOSIS — M4807 Spinal stenosis, lumbosacral region: Secondary | ICD-10-CM | POA: Diagnosis not present

## 2023-08-05 DIAGNOSIS — M545 Low back pain, unspecified: Secondary | ICD-10-CM | POA: Diagnosis not present

## 2023-08-09 DIAGNOSIS — D1801 Hemangioma of skin and subcutaneous tissue: Secondary | ICD-10-CM | POA: Diagnosis not present

## 2023-08-09 DIAGNOSIS — L578 Other skin changes due to chronic exposure to nonionizing radiation: Secondary | ICD-10-CM | POA: Diagnosis not present

## 2023-08-09 DIAGNOSIS — L821 Other seborrheic keratosis: Secondary | ICD-10-CM | POA: Diagnosis not present

## 2023-08-10 DIAGNOSIS — M545 Low back pain, unspecified: Secondary | ICD-10-CM | POA: Diagnosis not present

## 2023-08-10 DIAGNOSIS — M47816 Spondylosis without myelopathy or radiculopathy, lumbar region: Secondary | ICD-10-CM | POA: Diagnosis not present

## 2023-08-10 DIAGNOSIS — M4807 Spinal stenosis, lumbosacral region: Secondary | ICD-10-CM | POA: Diagnosis not present

## 2023-08-12 DIAGNOSIS — M4807 Spinal stenosis, lumbosacral region: Secondary | ICD-10-CM | POA: Diagnosis not present

## 2023-08-12 DIAGNOSIS — M47816 Spondylosis without myelopathy or radiculopathy, lumbar region: Secondary | ICD-10-CM | POA: Diagnosis not present

## 2023-08-12 DIAGNOSIS — M545 Low back pain, unspecified: Secondary | ICD-10-CM | POA: Diagnosis not present

## 2023-08-17 DIAGNOSIS — M545 Low back pain, unspecified: Secondary | ICD-10-CM | POA: Diagnosis not present

## 2023-08-17 DIAGNOSIS — M4807 Spinal stenosis, lumbosacral region: Secondary | ICD-10-CM | POA: Diagnosis not present

## 2023-08-17 DIAGNOSIS — M47816 Spondylosis without myelopathy or radiculopathy, lumbar region: Secondary | ICD-10-CM | POA: Diagnosis not present

## 2023-08-19 DIAGNOSIS — M4807 Spinal stenosis, lumbosacral region: Secondary | ICD-10-CM | POA: Diagnosis not present

## 2023-08-19 DIAGNOSIS — M47816 Spondylosis without myelopathy or radiculopathy, lumbar region: Secondary | ICD-10-CM | POA: Diagnosis not present

## 2023-08-19 DIAGNOSIS — M545 Low back pain, unspecified: Secondary | ICD-10-CM | POA: Diagnosis not present

## 2023-08-24 DIAGNOSIS — M4807 Spinal stenosis, lumbosacral region: Secondary | ICD-10-CM | POA: Diagnosis not present

## 2023-08-24 DIAGNOSIS — M545 Low back pain, unspecified: Secondary | ICD-10-CM | POA: Diagnosis not present

## 2023-08-24 DIAGNOSIS — M47816 Spondylosis without myelopathy or radiculopathy, lumbar region: Secondary | ICD-10-CM | POA: Diagnosis not present

## 2023-08-26 DIAGNOSIS — M47816 Spondylosis without myelopathy or radiculopathy, lumbar region: Secondary | ICD-10-CM | POA: Diagnosis not present

## 2023-08-26 DIAGNOSIS — M4807 Spinal stenosis, lumbosacral region: Secondary | ICD-10-CM | POA: Diagnosis not present

## 2023-08-26 DIAGNOSIS — Z23 Encounter for immunization: Secondary | ICD-10-CM | POA: Diagnosis not present

## 2023-08-26 DIAGNOSIS — M545 Low back pain, unspecified: Secondary | ICD-10-CM | POA: Diagnosis not present

## 2023-08-31 DIAGNOSIS — M545 Low back pain, unspecified: Secondary | ICD-10-CM | POA: Diagnosis not present

## 2023-08-31 DIAGNOSIS — M47816 Spondylosis without myelopathy or radiculopathy, lumbar region: Secondary | ICD-10-CM | POA: Diagnosis not present

## 2023-08-31 DIAGNOSIS — M4807 Spinal stenosis, lumbosacral region: Secondary | ICD-10-CM | POA: Diagnosis not present

## 2023-09-09 DIAGNOSIS — M4807 Spinal stenosis, lumbosacral region: Secondary | ICD-10-CM | POA: Diagnosis not present

## 2023-09-09 DIAGNOSIS — M47816 Spondylosis without myelopathy or radiculopathy, lumbar region: Secondary | ICD-10-CM | POA: Diagnosis not present

## 2023-09-09 DIAGNOSIS — M545 Low back pain, unspecified: Secondary | ICD-10-CM | POA: Diagnosis not present

## 2023-09-14 DIAGNOSIS — M47816 Spondylosis without myelopathy or radiculopathy, lumbar region: Secondary | ICD-10-CM | POA: Diagnosis not present

## 2023-09-14 DIAGNOSIS — M545 Low back pain, unspecified: Secondary | ICD-10-CM | POA: Diagnosis not present

## 2023-09-14 DIAGNOSIS — M4807 Spinal stenosis, lumbosacral region: Secondary | ICD-10-CM | POA: Diagnosis not present

## 2023-09-21 DIAGNOSIS — M47816 Spondylosis without myelopathy or radiculopathy, lumbar region: Secondary | ICD-10-CM | POA: Diagnosis not present

## 2023-09-21 DIAGNOSIS — M545 Low back pain, unspecified: Secondary | ICD-10-CM | POA: Diagnosis not present

## 2023-09-21 DIAGNOSIS — M4807 Spinal stenosis, lumbosacral region: Secondary | ICD-10-CM | POA: Diagnosis not present

## 2023-09-28 DIAGNOSIS — M47816 Spondylosis without myelopathy or radiculopathy, lumbar region: Secondary | ICD-10-CM | POA: Diagnosis not present

## 2023-09-28 DIAGNOSIS — M4807 Spinal stenosis, lumbosacral region: Secondary | ICD-10-CM | POA: Diagnosis not present

## 2023-09-28 DIAGNOSIS — M545 Low back pain, unspecified: Secondary | ICD-10-CM | POA: Diagnosis not present

## 2023-10-05 DIAGNOSIS — M545 Low back pain, unspecified: Secondary | ICD-10-CM | POA: Diagnosis not present

## 2023-10-05 DIAGNOSIS — M4807 Spinal stenosis, lumbosacral region: Secondary | ICD-10-CM | POA: Diagnosis not present

## 2023-10-05 DIAGNOSIS — M47816 Spondylosis without myelopathy or radiculopathy, lumbar region: Secondary | ICD-10-CM | POA: Diagnosis not present

## 2023-10-14 DIAGNOSIS — M4807 Spinal stenosis, lumbosacral region: Secondary | ICD-10-CM | POA: Diagnosis not present

## 2023-10-14 DIAGNOSIS — M47816 Spondylosis without myelopathy or radiculopathy, lumbar region: Secondary | ICD-10-CM | POA: Diagnosis not present

## 2023-10-14 DIAGNOSIS — M545 Low back pain, unspecified: Secondary | ICD-10-CM | POA: Diagnosis not present

## 2023-10-19 DIAGNOSIS — M4807 Spinal stenosis, lumbosacral region: Secondary | ICD-10-CM | POA: Diagnosis not present

## 2023-10-19 DIAGNOSIS — M545 Low back pain, unspecified: Secondary | ICD-10-CM | POA: Diagnosis not present

## 2023-10-19 DIAGNOSIS — M47816 Spondylosis without myelopathy or radiculopathy, lumbar region: Secondary | ICD-10-CM | POA: Diagnosis not present

## 2023-11-02 DIAGNOSIS — M545 Low back pain, unspecified: Secondary | ICD-10-CM | POA: Diagnosis not present

## 2023-11-02 DIAGNOSIS — M47816 Spondylosis without myelopathy or radiculopathy, lumbar region: Secondary | ICD-10-CM | POA: Diagnosis not present

## 2023-11-02 DIAGNOSIS — M4807 Spinal stenosis, lumbosacral region: Secondary | ICD-10-CM | POA: Diagnosis not present

## 2023-11-10 DIAGNOSIS — M47816 Spondylosis without myelopathy or radiculopathy, lumbar region: Secondary | ICD-10-CM | POA: Diagnosis not present

## 2023-11-10 DIAGNOSIS — Z6827 Body mass index (BMI) 27.0-27.9, adult: Secondary | ICD-10-CM | POA: Diagnosis not present

## 2023-11-10 DIAGNOSIS — M5416 Radiculopathy, lumbar region: Secondary | ICD-10-CM | POA: Diagnosis not present

## 2023-12-06 ENCOUNTER — Ambulatory Visit (INDEPENDENT_AMBULATORY_CARE_PROVIDER_SITE_OTHER): Payer: Medicare Other | Admitting: Internal Medicine

## 2023-12-06 ENCOUNTER — Encounter: Payer: Self-pay | Admitting: Internal Medicine

## 2023-12-06 VITALS — BP 140/88 | HR 79 | Temp 98.2°F | Ht 67.0 in | Wt 171.0 lb

## 2023-12-06 DIAGNOSIS — I493 Ventricular premature depolarization: Secondary | ICD-10-CM

## 2023-12-06 DIAGNOSIS — Z125 Encounter for screening for malignant neoplasm of prostate: Secondary | ICD-10-CM | POA: Diagnosis not present

## 2023-12-06 DIAGNOSIS — N400 Enlarged prostate without lower urinary tract symptoms: Secondary | ICD-10-CM

## 2023-12-06 DIAGNOSIS — K4091 Unilateral inguinal hernia, without obstruction or gangrene, recurrent: Secondary | ICD-10-CM

## 2023-12-06 DIAGNOSIS — K219 Gastro-esophageal reflux disease without esophagitis: Secondary | ICD-10-CM | POA: Diagnosis not present

## 2023-12-06 DIAGNOSIS — E291 Testicular hypofunction: Secondary | ICD-10-CM

## 2023-12-06 DIAGNOSIS — I10 Essential (primary) hypertension: Secondary | ICD-10-CM

## 2023-12-06 DIAGNOSIS — R739 Hyperglycemia, unspecified: Secondary | ICD-10-CM

## 2023-12-06 LAB — CBC
HCT: 46.6 % (ref 39.0–52.0)
Hemoglobin: 15.7 g/dL (ref 13.0–17.0)
MCHC: 33.8 g/dL (ref 30.0–36.0)
MCV: 84.7 fL (ref 78.0–100.0)
Platelets: 265 10*3/uL (ref 150.0–400.0)
RBC: 5.5 Mil/uL (ref 4.22–5.81)
RDW: 14.1 % (ref 11.5–15.5)
WBC: 8.8 10*3/uL (ref 4.0–10.5)

## 2023-12-06 LAB — LIPID PANEL
Cholesterol: 152 mg/dL (ref 0–200)
HDL: 39.6 mg/dL (ref >39.00)
LDL Cholesterol: 71 mg/dL (ref 0–99)
NonHDL: 112.34
Total CHOL/HDL Ratio: 4
Triglycerides: 209 mg/dL — ABNORMAL HIGH (ref 0.0–149.0)
VLDL: 41.8 mg/dL — ABNORMAL HIGH (ref 0.0–40.0)

## 2023-12-06 LAB — COMPREHENSIVE METABOLIC PANEL
ALT: 13 U/L (ref 0–53)
AST: 22 U/L (ref 0–37)
Albumin: 4.1 g/dL (ref 3.5–5.2)
Alkaline Phosphatase: 84 U/L (ref 39–117)
BUN: 15 mg/dL (ref 6–23)
CO2: 29 meq/L (ref 19–32)
Calcium: 8.9 mg/dL (ref 8.4–10.5)
Chloride: 102 meq/L (ref 96–112)
Creatinine, Ser: 1.06 mg/dL (ref 0.40–1.50)
GFR: 65.9 mL/min (ref >60.00)
Glucose, Bld: 87 mg/dL (ref 70–99)
Potassium: 3.9 meq/L (ref 3.5–5.1)
Sodium: 139 meq/L (ref 135–145)
Total Bilirubin: 0.6 mg/dL (ref 0.2–1.2)
Total Protein: 6.5 g/dL (ref 6.0–8.3)

## 2023-12-06 LAB — HEMOGLOBIN A1C: Hgb A1c MFr Bld: 5.6 % (ref 4.6–6.5)

## 2023-12-06 LAB — PSA: PSA: 2.94 ng/mL (ref 0.10–4.00)

## 2023-12-06 MED ORDER — HYDROCHLOROTHIAZIDE 25 MG PO TABS: 25.0000 mg | ORAL_TABLET | Freq: Every day | ORAL | 3 refills | Status: AC

## 2023-12-06 NOTE — Assessment & Plan Note (Signed)
Talked to surgeon and they recommended to wait on surgery until this is bothering him. Then he can return as needed for surgery.

## 2023-12-06 NOTE — Progress Notes (Signed)
   Subjective:   Patient ID: Antonio Ferrell, male    DOB: 1941/09/27, 83 y.o.   MRN: [id number]  HPI The patient is an 83 YO man coming in for follow up of medical conditions see A/P for details.   Review of Systems  Constitutional: Negative.   HENT: Negative.    Eyes: Negative.   Respiratory:  Negative for cough, chest tightness and shortness of breath.   Cardiovascular:  Negative for chest pain, palpitations and leg swelling.  Gastrointestinal:  Negative for abdominal distention, abdominal pain, constipation, diarrhea, nausea and vomiting.  Musculoskeletal:  Positive for back pain.  Skin: Negative.   Neurological: Negative.   Psychiatric/Behavioral: Negative.      Objective:  Physical Exam Constitutional:      Appearance: He is well-developed.  HENT:     Head: Normocephalic and atraumatic.  Cardiovascular:     Rate and Rhythm: Normal rate and regular rhythm.  Pulmonary:     Effort: Pulmonary effort is normal. No respiratory distress.     Breath sounds: Normal breath sounds. No wheezing or rales.  Abdominal:     General: Bowel sounds are normal. There is no distension.     Palpations: Abdomen is soft.     Tenderness: There is no abdominal tenderness. There is no rebound.  Musculoskeletal:     Cervical back: Normal range of motion.  Skin:    General: Skin is warm and dry.  Neurological:     Mental Status: He is alert and oriented to person, place, and time.     Coordination: Coordination normal.     Vitals:   12/06/23 1306 12/06/23 1311  BP: (!) 140/88 (!) 140/88  Pulse: 79   Temp: 98.2 F (36.8 C)   TempSrc: Oral   SpO2: 98%   Weight: 171 lb (77.6 kg)   Height: 5\' 7"  (1.702 m)     Assessment & Plan:

## 2023-12-06 NOTE — Assessment & Plan Note (Signed)
Not worse lately.

## 2023-12-06 NOTE — Assessment & Plan Note (Signed)
Checking PSA and adjust as needed. 

## 2023-12-06 NOTE — Assessment & Plan Note (Signed)
BP at goal and checking CMP and CBC and lipid panel. Adjust as needed and refilled today.

## 2023-12-06 NOTE — Assessment & Plan Note (Signed)
Taking prilosec 10 mg daily and can use as needed.

## 2023-12-07 ENCOUNTER — Encounter: Payer: Self-pay | Admitting: Internal Medicine

## 2024-01-20 ENCOUNTER — Ambulatory Visit: Payer: Medicare Other

## 2024-03-03 ENCOUNTER — Ambulatory Visit: Payer: Self-pay | Admitting: Sports Medicine

## 2024-03-03 VITALS — BP 151/85 | HR 89 | Ht 65.0 in | Wt 171.0 lb

## 2024-03-03 DIAGNOSIS — M48061 Spinal stenosis, lumbar region without neurogenic claudication: Secondary | ICD-10-CM

## 2024-03-03 DIAGNOSIS — M19011 Primary osteoarthritis, right shoulder: Secondary | ICD-10-CM

## 2024-03-03 DIAGNOSIS — Z0289 Encounter for other administrative examinations: Secondary | ICD-10-CM

## 2024-03-03 MED ORDER — CELECOXIB 200 MG PO CAPS: ORAL_CAPSULE | ORAL | 11 refills | Status: DC

## 2024-03-03 NOTE — Patient Instructions (Signed)
 Antonio Ferrell

## 2024-03-03 NOTE — Progress Notes (Signed)
    Procedures performed today:    None.  Independent interpretation of notes and tests performed by another provider:   None.  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  There were some additions to his medical today:  Meds: Aspirin for cardioprotection, no adverse effects. Prilosec for acid reflux, symptoms controlled, no symptoms of ulcer, no adverse effects. CACI qualified hypertension.  Visits healthcare professionals: Annual physical exam. Right inguinal hernia, asymptomatic, no surgery. Spinal stenosis asymptomatic with occasional use of NSAIDS, no neurologic symptoms or signs that would impair control of an aircraft. No other pain medication. Routine Eye exam.  Primary osteoarthritis, right shoulder Buddy does have some shoulder arthritis per his report, I am happy to address this in the future separate from this visit if he would like.   Lumbar spinal stenosis Radiographically severe lumbar spinal stenosis at multiple levels, no radicular symptoms, no progressive weakness, nothing that would interfere with his ability to control an aircraft. We did discuss treatment measures including celecoxib as well as home physical therapy, he will do the above. We did scan in a recent lumbar spine MRI, he would certainly be a candidate for epidurals in the future should the above fail. He needs to be very cautious about which medications he is put on as some are no fly medications.  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    ____________________________________________ Joselyn Nicely. Sandy Crumb, M.D., ABFM., CAQSM., AME. Primary Care and Sports Medicine [facility]  [facility]  Adjunct Professor of [facility] Medicine  [employer]  Restaurant manager, fast food

## 2024-03-03 NOTE — Assessment & Plan Note (Signed)
 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  There were some additions to his medical today:  Meds: Aspirin for cardioprotection, no adverse effects. Prilosec for acid reflux, symptoms controlled, no symptoms of ulcer, no adverse effects. CACI qualified hypertension.  Visits healthcare professionals: Annual physical exam. Right inguinal hernia, asymptomatic, no surgery. Spinal stenosis asymptomatic with occasional use of NSAIDS, no neurologic symptoms or signs that would impair control of an aircraft. No other pain medication. Routine Eye exam.

## 2024-03-03 NOTE — Addendum Note (Signed)
 Addended by: Gean Keels on: 03/03/2024 03:21 PM   Modules accepted: Orders

## 2024-03-03 NOTE — Assessment & Plan Note (Signed)
 Radiographically severe lumbar spinal stenosis at multiple levels, no radicular symptoms, no progressive weakness, nothing that would interfere with his ability to control an aircraft. We did discuss treatment measures including celecoxib as well as home physical therapy, he will do the above. We did scan in a recent lumbar spine MRI, he would certainly be a candidate for epidurals in the future should the above fail. He needs to be very cautious about which medications he is put on as some are no fly medications.  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

## 2024-03-03 NOTE — Assessment & Plan Note (Signed)
 Buddy does have some shoulder arthritis per his report, I am happy to address this in the future separate from this visit if he would like.

## 2024-05-01 DIAGNOSIS — H6122 Impacted cerumen, left ear: Secondary | ICD-10-CM | POA: Diagnosis not present

## 2024-05-25 DIAGNOSIS — H5212 Myopia, left eye: Secondary | ICD-10-CM | POA: Diagnosis not present

## 2024-05-25 DIAGNOSIS — Z961 Presence of intraocular lens: Secondary | ICD-10-CM | POA: Diagnosis not present

## 2024-05-25 DIAGNOSIS — H524 Presbyopia: Secondary | ICD-10-CM | POA: Diagnosis not present

## 2024-05-28 ENCOUNTER — Encounter: Payer: Self-pay | Admitting: Internal Medicine

## 2024-05-30 NOTE — Telephone Encounter (Unsigned)
 Copied from CRM [id number]. Topic: General - Other >> May 30, 2024  2:20 PM Jasmin G wrote: Reason for CRM: (Please refer to message sent by pt on 7/29). Pt states that he needs to get an MRI to see a neurosurgeon at [facility] since it's closer to his location, if possible he would like a call back from PCP to discuss some details

## 2024-05-30 NOTE — Telephone Encounter (Signed)
 Do patient need to be seen

## 2024-06-13 DIAGNOSIS — M5441 Lumbago with sciatica, right side: Secondary | ICD-10-CM | POA: Diagnosis not present

## 2024-06-13 DIAGNOSIS — M47816 Spondylosis without myelopathy or radiculopathy, lumbar region: Secondary | ICD-10-CM | POA: Diagnosis not present

## 2024-06-13 DIAGNOSIS — M48062 Spinal stenosis, lumbar region with neurogenic claudication: Secondary | ICD-10-CM | POA: Diagnosis not present

## 2024-06-13 DIAGNOSIS — M5442 Lumbago with sciatica, left side: Secondary | ICD-10-CM | POA: Diagnosis not present

## 2024-06-13 DIAGNOSIS — M4316 Spondylolisthesis, lumbar region: Secondary | ICD-10-CM | POA: Diagnosis not present

## 2024-06-13 DIAGNOSIS — M545 Low back pain, unspecified: Secondary | ICD-10-CM | POA: Diagnosis not present

## 2024-06-13 DIAGNOSIS — G8929 Other chronic pain: Secondary | ICD-10-CM | POA: Diagnosis not present

## 2024-06-16 DIAGNOSIS — M5442 Lumbago with sciatica, left side: Secondary | ICD-10-CM | POA: Diagnosis not present

## 2024-06-16 DIAGNOSIS — M5441 Lumbago with sciatica, right side: Secondary | ICD-10-CM | POA: Diagnosis not present

## 2024-06-30 DIAGNOSIS — M48062 Spinal stenosis, lumbar region with neurogenic claudication: Secondary | ICD-10-CM | POA: Diagnosis not present

## 2024-06-30 DIAGNOSIS — M5416 Radiculopathy, lumbar region: Secondary | ICD-10-CM | POA: Diagnosis not present

## 2024-06-30 DIAGNOSIS — Z6827 Body mass index (BMI) 27.0-27.9, adult: Secondary | ICD-10-CM | POA: Diagnosis not present

## 2024-07-04 ENCOUNTER — Encounter: Payer: Self-pay | Admitting: Sports Medicine

## 2024-08-14 DIAGNOSIS — Z23 Encounter for immunization: Secondary | ICD-10-CM | POA: Diagnosis not present

## 2024-08-28 DIAGNOSIS — Z23 Encounter for immunization: Secondary | ICD-10-CM | POA: Diagnosis not present

## 2024-12-06 ENCOUNTER — Encounter: Payer: Self-pay | Admitting: Internal Medicine

## 2024-12-06 ENCOUNTER — Ambulatory Visit: Admitting: Internal Medicine

## 2024-12-06 VITALS — BP 130/80 | HR 68 | Temp 97.9°F | Ht 65.0 in | Wt 172.6 lb

## 2024-12-06 DIAGNOSIS — K4091 Unilateral inguinal hernia, without obstruction or gangrene, recurrent: Secondary | ICD-10-CM

## 2024-12-06 DIAGNOSIS — M48061 Spinal stenosis, lumbar region without neurogenic claudication: Secondary | ICD-10-CM

## 2024-12-06 DIAGNOSIS — N400 Enlarged prostate without lower urinary tract symptoms: Secondary | ICD-10-CM

## 2024-12-06 DIAGNOSIS — R739 Hyperglycemia, unspecified: Secondary | ICD-10-CM

## 2024-12-06 DIAGNOSIS — E291 Testicular hypofunction: Secondary | ICD-10-CM

## 2024-12-06 DIAGNOSIS — I10 Essential (primary) hypertension: Secondary | ICD-10-CM

## 2024-12-06 DIAGNOSIS — Z125 Encounter for screening for malignant neoplasm of prostate: Secondary | ICD-10-CM

## 2024-12-06 LAB — LIPID PANEL
Cholesterol: 153 mg/dL (ref 28–200)
HDL: 48 mg/dL
LDL Cholesterol: 70 mg/dL (ref 10–99)
NonHDL: 104.94
Total CHOL/HDL Ratio: 3
Triglycerides: 173 mg/dL — ABNORMAL HIGH (ref 10.0–149.0)
VLDL: 34.6 mg/dL (ref 0.0–40.0)

## 2024-12-06 LAB — COMPREHENSIVE METABOLIC PANEL WITH GFR
ALT: 17 U/L (ref 3–53)
AST: 29 U/L (ref 5–37)
Albumin: 4.2 g/dL (ref 3.5–5.2)
Alkaline Phosphatase: 82 U/L (ref 39–117)
BUN: 17 mg/dL (ref 6–23)
CO2: 26 meq/L (ref 19–32)
Calcium: 9.2 mg/dL (ref 8.4–10.5)
Chloride: 105 meq/L (ref 96–112)
Creatinine, Ser: 1.13 mg/dL (ref 0.40–1.50)
GFR: 60.6 mL/min
Glucose, Bld: 94 mg/dL (ref 70–99)
Potassium: 3.9 meq/L (ref 3.5–5.1)
Sodium: 141 meq/L (ref 135–145)
Total Bilirubin: 0.8 mg/dL (ref 0.2–1.2)
Total Protein: 6.5 g/dL (ref 6.0–8.3)

## 2024-12-06 LAB — CBC
HCT: 45.5 % (ref 39.0–52.0)
Hemoglobin: 15.4 g/dL (ref 13.0–17.0)
MCHC: 33.9 g/dL (ref 30.0–36.0)
MCV: 86.6 fl (ref 78.0–100.0)
Platelets: 227 10*3/uL (ref 150.0–400.0)
RBC: 5.25 Mil/uL (ref 4.22–5.81)
RDW: 14.5 % (ref 11.5–15.5)
WBC: 7.8 10*3/uL (ref 4.0–10.5)

## 2024-12-06 LAB — HEMOGLOBIN A1C: Hgb A1c MFr Bld: 5.5 % (ref 4.6–6.5)

## 2024-12-06 LAB — PSA: PSA: 2.68 ng/mL (ref 0.10–4.00)

## 2024-12-06 NOTE — Patient Instructions (Signed)
 We will check the labs today.

## 2024-12-06 NOTE — Progress Notes (Unsigned)
 "  Subjective:   Patient ID: Antonio Ferrell, male    DOB: [birth year], 84 y.o.   MRN: [id number]  Discussed the use of AI scribe software for clinical note transcription with the patient, who gave verbal consent to proceed.  History of Present Illness Antonio Ferrell is an 84 year old male with lower spinal stenosis and spondylolisthesis who presents for follow-up of his back pain management.  He has a history of lower spinal stenosis and spondylolisthesis, causing lower back pain radiating to both legs, more pronounced in the right leg. An MRI was performed during consultations with a spine specialist at [facility] and [facility]. Epidural injections have been administered, resulting in symptom improvement. He engages in physical therapy focusing on core strengthening and maintains a routine of treadmill exercises three times a week, which he believes helps manage his symptoms. However, he still experiences some pain, especially during the winter when he is less active.  He mentions a longstanding hernia that has been present for years.  No new chest pain, breathing problems, gastrointestinal issues, or headaches. He reports good vision post-cataract surgeries with no significant changes. He has some skin spots that require evaluation by a dermatologist, as he regularly has lesions removed. He has never smoked and maintains an active lifestyle, including flying as a hobby.  Review of Systems  Constitutional: Negative.   HENT: Negative.    Eyes: Negative.   Respiratory:  Negative for cough, chest tightness and shortness of breath.   Cardiovascular:  Negative for chest pain, palpitations and leg swelling.  Gastrointestinal:  Negative for abdominal distention, abdominal pain, constipation, diarrhea, nausea and vomiting.  Musculoskeletal: Negative.   Skin: Negative.   Neurological: Negative.   Psychiatric/Behavioral: Negative.      Objective:  Physical  Exam Constitutional:      Appearance: He is well-developed.  HENT:     Head: Normocephalic and atraumatic.  Cardiovascular:     Rate and Rhythm: Normal rate and regular rhythm.  Pulmonary:     Effort: Pulmonary effort is normal. No respiratory distress.     Breath sounds: Normal breath sounds. No wheezing or rales.  Abdominal:     General: Bowel sounds are normal. There is no distension.     Palpations: Abdomen is soft.     Tenderness: There is no abdominal tenderness.  Musculoskeletal:     Cervical back: Normal range of motion.  Skin:    General: Skin is warm and dry.  Neurological:     Mental Status: He is alert and oriented to person, place, and time.     Coordination: Coordination normal.     Vitals:   12/06/24 1059  BP: 130/80  Pulse: 68  Temp: 97.9 F (36.6 C)  TempSrc: Oral  SpO2: 97%  Weight: 172 lb 9.6 oz (78.3 kg)  Height: 5' 5 (1.651 m)    Assessment and Plan Assessment & Plan Lumbar spinal stenosis with spondylolisthesis   Chronic lumbar spinal stenosis with spondylolisthesis causes lower back pain radiating to both legs, more pronounced in the right leg. Previous epidural injections provided relief. He is engaging in physical therapy and core strengthening exercises to manage symptoms. Continue core strengthening exercises and physical therapy. Consider repeat epidural injections if symptoms worsen.  Inguinal hernia   Chronic abdominal hernia has been present for years. Previous consultation advised monitoring unless symptoms worsen or surgical intervention is needed. Continue to monitor for changes or worsening symptoms. Consider surgical consultation if symptoms worsen.  Hypogonadism Checking PSA and adjust as needed.   Hyperglycemia Checking HgA1c and adjust as needed.   Primary hypertension   Blood pressure is well-controlled with current management. He engages in regular physical activity, including treadmill and core exercises. Continue current  blood pressure management regimen. Encourage regular physical activity. Checking labs today.    "

## 2025-03-05 ENCOUNTER — Ambulatory Visit: Admitting: Internal Medicine
